# Patient Record
Sex: Female | Born: 1985 | Race: Black or African American | Hispanic: No | Marital: Married | Smoking: Never smoker
Health system: Southern US, Community
[De-identification: ages and names within clinical notes are randomized; demographics above are authoritative.]

## PROBLEM LIST (undated history)

## (undated) DIAGNOSIS — G44209 Tension-type headache, unspecified, not intractable: Secondary | ICD-10-CM

## (undated) DIAGNOSIS — D649 Anemia, unspecified: Secondary | ICD-10-CM

## (undated) DIAGNOSIS — D219 Benign neoplasm of connective and other soft tissue, unspecified: Secondary | ICD-10-CM

## (undated) DIAGNOSIS — Z803 Family history of malignant neoplasm of breast: Secondary | ICD-10-CM

## (undated) DIAGNOSIS — N920 Excessive and frequent menstruation with regular cycle: Secondary | ICD-10-CM

## (undated) DIAGNOSIS — H05119 Granuloma of unspecified orbit: Secondary | ICD-10-CM

## (undated) HISTORY — DX: Granuloma of unspecified orbit: H05.119

## (undated) HISTORY — DX: Benign neoplasm of connective and other soft tissue, unspecified: D21.9

## (undated) HISTORY — DX: Excessive and frequent menstruation with regular cycle: N92.0

## (undated) HISTORY — DX: Anemia, unspecified: D64.9

## (undated) HISTORY — DX: Family history of malignant neoplasm of breast: Z80.3

---

## 2007-11-14 DIAGNOSIS — O368390 Maternal care for abnormalities of the fetal heart rate or rhythm, unspecified trimester, not applicable or unspecified: Secondary | ICD-10-CM

## 2007-11-14 DIAGNOSIS — O4100X0 Oligohydramnios, unspecified trimester, not applicable or unspecified: Secondary | ICD-10-CM

## 2013-12-12 ENCOUNTER — Emergency Department: Payer: Self-pay | Admitting: Emergency Medicine

## 2014-01-12 ENCOUNTER — Emergency Department: Payer: Self-pay | Admitting: Emergency Medicine

## 2015-08-08 ENCOUNTER — Encounter: Payer: Self-pay | Admitting: Emergency Medicine

## 2015-08-08 ENCOUNTER — Emergency Department
Admission: EM | Admit: 2015-08-08 | Discharge: 2015-08-08 | Disposition: A | Discharge status: Home / Self Care | Payer: Self-pay | Attending: Emergency Medicine | Admitting: Emergency Medicine

## 2015-08-08 DIAGNOSIS — J301 Allergic rhinitis due to pollen: Secondary | ICD-10-CM | POA: Insufficient documentation

## 2015-08-08 DIAGNOSIS — H9201 Otalgia, right ear: Secondary | ICD-10-CM | POA: Insufficient documentation

## 2015-08-08 DIAGNOSIS — H6981 Other specified disorders of Eustachian tube, right ear: Secondary | ICD-10-CM

## 2015-08-08 DIAGNOSIS — H6991 Unspecified Eustachian tube disorder, right ear: Secondary | ICD-10-CM | POA: Insufficient documentation

## 2015-08-08 MED ORDER — CETIRIZINE HCL 10 MG PO TABS: 10.0000 mg | ORAL_TABLET | Freq: Every day | ORAL | Status: DC

## 2015-08-08 MED ORDER — FLUTICASONE PROPIONATE 50 MCG/ACT NA SUSP
1.0000 | Freq: Two times a day (BID) | NASAL | Status: DC
Start: 2015-08-08 — End: 2016-09-27

## 2015-08-08 NOTE — ED Provider Notes (Signed)
[facility] Emergency Department Provider Note  ____________________________________________  Time seen: Approximately 3:23 PM  I have reviewed the triage vital signs and the nursing notes.   HISTORY  Chief Complaint Sinus Problem    HPI Tammy Khan is a 30 y.o. female who presents to emergency department complaining of sinus polyp. Patient states that she has had congestion, sneezing, ear pressure/pain 1 week. Patient states that last night she had to prop herself up on pillows due to the amount of nasal drainage going down the back or throat. Patient denies any facial pressure, headache, visual acuity changes, difficulty breathing, sore throat, chest pain, cough. Patient does endorse nasal congestion, ear pressure, sneezing. The patient does have a history of allergic rhinitis during allergy season but has not taken any medications prior to arrival.   History reviewed. No pertinent past medical history.  There are no active problems to display for this patient.   History reviewed. No pertinent past surgical history.  Current Outpatient Rx  Name  Route  Sig  Dispense  Refill  . cetirizine (ZYRTEC) 10 MG tablet   Oral   Take 1 tablet (10 mg total) by mouth daily.   30 tablet   0   . fluticasone (FLONASE) 50 MCG/ACT nasal spray   Each Nare   Place 1 spray into both nostrils 2 (two) times daily.   16 g   0     Allergies Review of patient's allergies indicates no known allergies.  No family history on file.  Social History Social History  Substance Use Topics  . Smoking status: Never Smoker   . Smokeless tobacco: None  . Alcohol Use: No     Review of Systems  Constitutional: No fever/chills Eyes: No visual changes. No discharge ENT: Positive for nasal congestion and sneezing. Positive for right ear pain/pressure. Cardiovascular: no chest pain. Respiratory: no cough. No SOB. Gastrointestinal: No abdominal pain.  No nausea,  no vomiting.  No diarrhea.  No constipation. Musculoskeletal: Negative for musculoskeletal pain. Skin: Negative for rash, abrasions, lacerations, ecchymosis. Neurological: Negative for headaches, focal weakness or numbness. 10-point ROS otherwise negative.  ____________________________________________   PHYSICAL EXAM:  VITAL SIGNS: ED Triage Vitals  Enc Vitals Group     BP 08/08/15 1402 127/82 mmHg     Pulse Rate 08/08/15 1402 96     Resp 08/08/15 1402 20     Temp 08/08/15 1402 99.1 F (37.3 C)     Temp Source 08/08/15 1402 Oral     SpO2 08/08/15 1402 98 %     Weight 08/08/15 1402 145 lb (65.772 kg)     Height 08/08/15 1402 5\' 4"  (1.626 m)     Head Cir --      Peak Flow --      Pain Score 08/08/15 1403 6     Pain Loc --      Pain Edu? --      Excl. in [place]? --      Constitutional: Alert and oriented. Well appearing and in no acute distress. Eyes: Conjunctivae are normal. PERRL. EOMI. Head: Atraumatic. ENT:      Ears: EACs are unremarkable bilaterally. TM on right is bulging with fluid behind it. Left sided TM is unremarkable.      Nose: Moderate clear congestion/rhinnorhea. Turbinates are boggy in appearance. Patient is nontender to percussion over frontal, ethmoid, maxillary sinuses.      Mouth/Throat: Mucous membranes are moist. Oropharynx is nonerythematous and nonedematous. Neck: No stridor.  Cardiovascular: Normal rate, regular rhythm. Normal S1 and S2.  Good peripheral circulation. Respiratory: Normal respiratory effort without tachypnea or retractions. Lungs CTAB. Good air entry to the bases with no decreased or absent breath sounds. Musculoskeletal: Full range of motion to all extremities. No gross deformities appreciated. Neurologic:  Normal speech and language. No gross focal neurologic deficits are appreciated.  Skin:  Skin is warm, dry and intact. No rash noted. Psychiatric: Mood and affect are normal. Speech and behavior are normal. Patient exhibits  appropriate insight and judgement.   ____________________________________________   LABS (all labs ordered are listed, but only abnormal results are displayed)  Labs Reviewed - No data to display ____________________________________________  EKG   ____________________________________________  RADIOLOGY   No results found.  ____________________________________________    PROCEDURES  Procedure(s) performed:       Medications - No data to display   ____________________________________________   INITIAL IMPRESSION / ASSESSMENT AND PLAN / ED COURSE  Pertinent labs & imaging results that were available during my care of the patient were reviewed by me and considered in my medical decision making (see chart for details).  Patient's diagnosis is consistent with allergic rhinitis with eustachian tube dysfunction on the right side. Exam is reassuring with no signs of bacterial sinusitis. Patient will be discharged home with prescriptions for Zyrtec and Flonase. Patient is to follow up with primary care provider as needed or otherwise directed. Patient is given ED precautions to return to the ED for any worsening or new symptoms.     ____________________________________________  FINAL CLINICAL IMPRESSION(S) / ED DIAGNOSES  Final diagnoses:  Allergic rhinitis due to pollen  Eustachian tube dysfunction, right      NEW MEDICATIONS STARTED DURING THIS VISIT:  New Prescriptions   CETIRIZINE (ZYRTEC) 10 MG TABLET    Take 1 tablet (10 mg total) by mouth daily.   FLUTICASONE (FLONASE) 50 MCG/ACT NASAL SPRAY    Place 1 spray into both nostrils 2 (two) times daily.        This chart was dictated using voice recognition software/Dragon. Despite best efforts to proofread, errors can occur which can change the meaning. Any change was purely unintentional.    Darletta Moll, PA-C 08/08/15 [place], MD 08/08/15 [phone]

## 2015-08-08 NOTE — ED Notes (Signed)
Patient presents to the ED with congestion and sinus pressure x 1 week.  Patient is complaining of right ear pain.  Patient reports difficulty sleeping at night, stating that she has to prop herself up more due to her congestion.  Patient is in no obvious distress at this time.

## 2015-08-08 NOTE — Discharge Instructions (Signed)
Allergic Rhinitis Allergic rhinitis is when the mucous membranes in the nose respond to allergens. Allergens are particles in the air that cause your body to have an allergic reaction. This causes you to release allergic antibodies. Through a chain of events, these eventually cause you to release histamine into the blood stream. Although meant to protect the body, it is this release of histamine that causes your discomfort, such as frequent sneezing, congestion, and an itchy, runny nose.  CAUSES Seasonal allergic rhinitis (hay fever) is caused by pollen allergens that may come from grasses, trees, and weeds. Year-round allergic rhinitis (perennial allergic rhinitis) is caused by allergens such as house dust mites, pet dander, and mold spores. SYMPTOMS  Nasal stuffiness (congestion).  Itchy, runny nose with sneezing and tearing of the eyes. DIAGNOSIS Your health care provider can help you determine the allergen or allergens that trigger your symptoms. If you and your health care provider are unable to determine the allergen, skin or blood testing may be used. Your health care provider will diagnose your condition after taking your health history and performing a physical exam. Your health care provider may assess you for other related conditions, such as asthma, pink eye, or an ear infection. TREATMENT Allergic rhinitis does not have a cure, but it can be controlled by:  Medicines that block allergy symptoms. These may include allergy shots, nasal sprays, and oral antihistamines.  Avoiding the allergen. Hay fever may often be treated with antihistamines in pill or nasal spray forms. Antihistamines block the effects of histamine. There are over-the-counter medicines that may help with nasal congestion and swelling around the eyes. Check with your health care provider before taking or giving this medicine. If avoiding the allergen or the medicine prescribed do not work, there are many new medicines  your health care provider can prescribe. Stronger medicine may be used if initial measures are ineffective. Desensitizing injections can be used if medicine and avoidance does not work. Desensitization is when a patient is given ongoing shots until the body becomes less sensitive to the allergen. Make sure you follow up with your health care provider if problems continue. HOME CARE INSTRUCTIONS It is not possible to completely avoid allergens, but you can reduce your symptoms by taking steps to limit your exposure to them. It helps to know exactly what you are allergic to so that you can avoid your specific triggers. SEEK MEDICAL CARE IF:  You have a fever.  You develop a cough that does not stop easily (persistent).  You have shortness of breath.  You start wheezing.  Symptoms interfere with normal daily activities.   This information is not intended to replace advice given to you by your health care provider. Make sure you discuss any questions you have with your health care provider.   Document Released: 12/20/2000 Document Revised: 04/17/2014 Document Reviewed: 12/02/2012 Elsevier Interactive Patient Education 2016 [place] media is inflammation of your middle ear. This occurs when the auditory tube (eustachian tube) leading from the back of your nose (nasopharynx) to your eardrum is blocked. This blockage may result from a cold, environmental allergies, or an upper respiratory infection. Unresolved barotitis media may lead to damage or hearing loss (barotrauma), which may become permanent. HOME CARE INSTRUCTIONS   Use medicines as recommended by your health care provider. Over-the-counter medicines will help unblock the canal and can help during times of air travel.  Do not put anything into your ears to clean or unplug them. Eardrops  will not be helpful.  Do not swim, dive, or fly until your health care provider says it is all right to do so. If these  activities are necessary, chewing gum with frequent, forceful swallowing may help. It is also helpful to hold your nose and gently blow to pop your ears for equalizing pressure changes. This forces air into the eustachian tube.  Only take over-the-counter or prescription medicines for pain, discomfort, or fever as directed by your health care provider.  A decongestant may be helpful in decongesting the middle ear and make pressure equalization easier. SEEK MEDICAL CARE IF:  You experience a serious form of dizziness in which you feel as if the room is spinning and you feel nauseated (vertigo).  Your symptoms only involve one ear. SEEK IMMEDIATE MEDICAL CARE IF:   You develop a severe headache, dizziness, or severe ear pain.  You have bloody or pus-like drainage from your ears.  You develop a fever.  Your problems do not improve or become worse. MAKE SURE YOU:   Understand these instructions.  Will watch your condition.  Will get help right away if you are not doing well or get worse.   This information is not intended to replace advice given to you by your health care provider. Make sure you discuss any questions you have with your health care provider.   Document Released: 03/24/2000 Document Revised: 01/15/2013 Document Reviewed: 10/22/2012 Elsevier Interactive Patient Education [employer].

## 2015-09-07 LAB — HM PAP SMEAR: HM Pap smear: NEGATIVE

## 2015-12-04 ENCOUNTER — Encounter: Payer: Self-pay | Admitting: Emergency Medicine

## 2015-12-04 ENCOUNTER — Emergency Department
Admission: EM | Admit: 2015-12-04 | Discharge: 2015-12-04 | Disposition: A | Discharge status: Home / Self Care | Payer: Medicaid Other | Attending: Emergency Medicine | Admitting: Emergency Medicine

## 2015-12-04 DIAGNOSIS — Z79899 Other long term (current) drug therapy: Secondary | ICD-10-CM | POA: Insufficient documentation

## 2015-12-04 DIAGNOSIS — X58XXXA Exposure to other specified factors, initial encounter: Secondary | ICD-10-CM | POA: Insufficient documentation

## 2015-12-04 DIAGNOSIS — Y929 Unspecified place or not applicable: Secondary | ICD-10-CM | POA: Insufficient documentation

## 2015-12-04 DIAGNOSIS — S39012A Strain of muscle, fascia and tendon of lower back, initial encounter: Secondary | ICD-10-CM | POA: Insufficient documentation

## 2015-12-04 DIAGNOSIS — Z7951 Long term (current) use of inhaled steroids: Secondary | ICD-10-CM | POA: Insufficient documentation

## 2015-12-04 DIAGNOSIS — Y999 Unspecified external cause status: Secondary | ICD-10-CM | POA: Insufficient documentation

## 2015-12-04 DIAGNOSIS — Y939 Activity, unspecified: Secondary | ICD-10-CM | POA: Insufficient documentation

## 2015-12-04 LAB — URINALYSIS COMPLETE WITH MICROSCOPIC (ARMC ONLY)
BACTERIA UA: NONE SEEN
BILIRUBIN URINE: NEGATIVE
GLUCOSE, UA: NEGATIVE mg/dL
KETONES UR: NEGATIVE mg/dL
LEUKOCYTES UA: NEGATIVE
Nitrite: NEGATIVE
Protein, ur: NEGATIVE mg/dL
Specific Gravity, Urine: 1.027 (ref 1.005–1.030)
pH: 5 (ref 5.0–8.0)

## 2015-12-04 MED ORDER — METHOCARBAMOL 500 MG PO TABS: 500.0000 mg | ORAL_TABLET | Freq: Four times a day (QID) | ORAL | 0 refills | Status: DC

## 2015-12-04 MED ORDER — MELOXICAM 15 MG PO TABS: 15.0000 mg | ORAL_TABLET | Freq: Every day | ORAL | 0 refills | Status: DC

## 2015-12-04 NOTE — ED Triage Notes (Signed)
Low back pain today - denies injury

## 2015-12-04 NOTE — ED Provider Notes (Signed)
[facility] Emergency Department Provider Note  ____________________________________________  Time seen: Approximately 10:59 PM  I have reviewed the triage vital signs and the nursing notes.   HISTORY  Chief Complaint Back Pain    HPI Tammy Khan is a 30 y.o. female who presents emergency department complaining of lower back pain. Patient states that she has no known injury. She is concerned that she might have a "UTI." Patient denies any dominant pain, suprapubic pain, dysuria, polyuria, hematuria. No other complaints. Patient is not having medications prior to arrival. Patient reports that pain is worse on the right side than left. It is an aching sensation. Mild to moderate.   History reviewed. No pertinent past medical history.  There are no active problems to display for this patient.   History reviewed. No pertinent surgical history.  Prior to Admission medications   Medication Sig Start Date End Date Taking? Authorizing Provider  cetirizine (ZYRTEC) 10 MG tablet Take 1 tablet (10 mg total) by mouth daily. 08/08/15   Charline Bills Nicholus Chandran, PA-C  fluticasone (FLONASE) 50 MCG/ACT nasal spray Place 1 spray into both nostrils 2 (two) times daily. 08/08/15   Charline Bills Toshika Parrow, PA-C  meloxicam (MOBIC) 15 MG tablet Take 1 tablet (15 mg total) by mouth daily. 12/04/15   Charline Bills Thoma Paulsen, PA-C  methocarbamol (ROBAXIN) 500 MG tablet Take 1 tablet (500 mg total) by mouth 4 (four) times daily. 12/04/15   Charline Bills Haizel Gatchell, PA-C    Allergies Review of patient's allergies indicates no known allergies.  History reviewed. No pertinent family history.  Social History Social History  Substance Use Topics  . Smoking status: Never Smoker  . Smokeless tobacco: Never Used  . Alcohol use No     Review of Systems  Constitutional: No fever/chills Cardiovascular: no chest pain. Respiratory: no cough. No SOB. Gastrointestinal: No abdominal pain.   No nausea, no vomiting.  No diarrhea.  No constipation. Genitourinary: Negative for dysuria. Negative for polyuria. No hematuria Musculoskeletal: Positive for low back pain Skin: Negative for rash, abrasions, lacerations, ecchymosis. Neurological: Negative for headaches, focal weakness or numbness. 10-point ROS otherwise negative.  ____________________________________________   PHYSICAL EXAM:  VITAL SIGNS: ED Triage Vitals  Enc Vitals Group     BP 12/04/15 2149 133/89     Pulse Rate 12/04/15 2149 77     Resp 12/04/15 2149 16     Temp 12/04/15 2149 98 F (36.7 C)     Temp src --      SpO2 12/04/15 2149 100 %     Weight 12/04/15 2150 165 lb (74.8 kg)     Height 12/04/15 2150 5' 1.5" (1.562 m)     Head Circumference --      Peak Flow --      Pain Score 12/04/15 2150 5     Pain Loc --      Pain Edu? --      Excl. in [place]? --      Constitutional: Alert and oriented. Well appearing and in no acute distress. Eyes: Conjunctivae are normal. PERRL. EOMI. Head: Atraumatic. Neck: No stridor.    Cardiovascular: Normal rate, regular rhythm. Normal S1 and S2.  Good peripheral circulation. Respiratory: Normal respiratory effort without tachypnea or retractions. Lungs CTAB. Good air entry to the bases with no decreased or absent breath sounds. Gastrointestinal: Bowel sounds 4 quadrants. Soft and nontender to palpation. No guarding or rigidity. No palpable masses. No distention. No CVA tenderness. Musculoskeletal: Full range of motion  to all extremities. No gross deformities appreciated.No deformities to spine noted upon inspection. Full range of motion of the spine. Patient is diffusely tender to palpation on lumbar paraspinal muscle group. No palpable abnormality to the lumbar spine. No tenderness to palpation over bilateral sciatic notches. Negative straight leg raise bilaterally. Dorsalis pedis pulse intact distally bilaterally. Sensation intact and equal lower extremities. Neurologic:   Normal speech and language. No gross focal neurologic deficits are appreciated.  Skin:  Skin is warm, dry and intact. No rash noted. Psychiatric: Mood and affect are normal. Speech and behavior are normal. Patient exhibits appropriate insight and judgement.   ____________________________________________   LABS (all labs ordered are listed, but only abnormal results are displayed)  Labs Reviewed  URINALYSIS COMPLETEWITH MICROSCOPIC ([place] ONLY) - Abnormal; Notable for the following:       Result Value   Color, Urine YELLOW (*)    APPearance CLEAR (*)    Hgb urine dipstick 1+ (*)    Squamous Epithelial / LPF 6-30 (*)    All other components within normal limits   ____________________________________________  EKG   ____________________________________________  RADIOLOGY   No results found.  ____________________________________________    PROCEDURES  Procedure(s) performed:    Procedures    Medications - No data to display   ____________________________________________   INITIAL IMPRESSION / ASSESSMENT AND PLAN / ED COURSE  Pertinent labs & imaging results that were available during my care of the patient were reviewed by me and considered in my medical decision making (see chart for details).  Review of the [place] CSRS was performed in accordance of the [place] prior to dispensing any controlled drugs.  Clinical Course    Patient's diagnosis is consistent with Lumbar strain. Exam is reassuring. Urinalysis returned with no indication of UTI. Exam is consistent with lumbar strain.. Patient will be discharged home with prescriptions for anti-inflammatories and muscle relaxers. Patient is to follow up with primary care as needed or otherwise directed. Patient is given ED precautions to return to the ED for any worsening or new symptoms.     ____________________________________________  FINAL CLINICAL IMPRESSION(S) / ED DIAGNOSES  Final diagnoses:  Lumbar  strain, initial encounter      NEW MEDICATIONS STARTED DURING THIS VISIT:  New Prescriptions   MELOXICAM (MOBIC) 15 MG TABLET    Take 1 tablet (15 mg total) by mouth daily.   METHOCARBAMOL (ROBAXIN) 500 MG TABLET    Take 1 tablet (500 mg total) by mouth 4 (four) times daily.        This chart was dictated using voice recognition software/Dragon. Despite best efforts to proofread, errors can occur which can change the meaning. Any change was purely unintentional.    Darletta Moll, PA-C 12/04/15 [id number]    Eula Listen, MD 12/04/15 [id number]

## 2016-03-31 ENCOUNTER — Emergency Department
Admission: EM | Admit: 2016-03-31 | Discharge: 2016-03-31 | Disposition: A | Discharge status: Home / Self Care | Payer: Self-pay | Attending: Emergency Medicine | Admitting: Emergency Medicine

## 2016-03-31 ENCOUNTER — Emergency Department: Payer: Self-pay

## 2016-03-31 DIAGNOSIS — Z79899 Other long term (current) drug therapy: Secondary | ICD-10-CM | POA: Insufficient documentation

## 2016-03-31 DIAGNOSIS — N939 Abnormal uterine and vaginal bleeding, unspecified: Secondary | ICD-10-CM

## 2016-03-31 DIAGNOSIS — D259 Leiomyoma of uterus, unspecified: Secondary | ICD-10-CM | POA: Insufficient documentation

## 2016-03-31 LAB — URINALYSIS, ROUTINE W REFLEX MICROSCOPIC: Specific Gravity, Urine: 1.016 (ref 1.005–1.030)

## 2016-03-31 LAB — BASIC METABOLIC PANEL
ANION GAP: 6 (ref 5–15)
BUN: 6 mg/dL (ref 6–20)
CO2: 26 mmol/L (ref 22–32)
Calcium: 8.9 mg/dL (ref 8.9–10.3)
Chloride: 104 mmol/L (ref 101–111)
Creatinine, Ser: 0.52 mg/dL (ref 0.44–1.00)
Glucose, Bld: 102 mg/dL — ABNORMAL HIGH (ref 65–99)
POTASSIUM: 3.5 mmol/L (ref 3.5–5.1)
SODIUM: 136 mmol/L (ref 135–145)

## 2016-03-31 LAB — CBC
HCT: 36.9 % (ref 35.0–47.0)
Hemoglobin: 12.5 g/dL (ref 12.0–16.0)
MCH: 27.2 pg (ref 26.0–34.0)
MCHC: 33.7 g/dL (ref 32.0–36.0)
MCV: 80.5 fL (ref 80.0–100.0)
PLATELETS: 355 10*3/uL (ref 150–440)
RBC: 4.58 MIL/uL (ref 3.80–5.20)
RDW: 15.6 % — ABNORMAL HIGH (ref 11.5–14.5)
WBC: 5.5 10*3/uL (ref 3.6–11.0)

## 2016-03-31 LAB — PREGNANCY, URINE: Preg Test, Ur: NEGATIVE

## 2016-03-31 MED ORDER — TRANEXAMIC ACID 650 MG PO TABS
1300.0000 mg | ORAL_TABLET | Freq: Once | ORAL | Status: AC
  Administered 2016-03-31: 1300 mg via ORAL
  Filled 2016-03-31: qty 2

## 2016-03-31 MED ORDER — TRANEXAMIC ACID 650 MG PO TABS: 1300.0000 mg | ORAL_TABLET | Freq: Three times a day (TID) | ORAL | 0 refills | Status: DC

## 2016-03-31 NOTE — ED Notes (Addendum)
Pelvic exam done at this time with ED provider

## 2016-03-31 NOTE — ED Triage Notes (Signed)
Pt on menstrual cycle that began yesterday. Reports very heavy, changing tampons every 2 hours. Pt alert and oriented X4, active, cooperative, pt in NAD. RR even and unlabored, color WNL.  Denies pain

## 2016-03-31 NOTE — Consult Note (Signed)
Consult History and Physical   SERVICE: Gynecology   Patient Name: Tammy Khan Patient MRN:   [id number]  CC: Heavy menstrual bleeding  HPI: Tammy Khan is a 30 y.o.  G1P1 with heavy menstrual bleeding. She reports a two-year history of cyclic heavy bleeding. She is actively trying to conceive. This morning she arrived at work on her period, but with a super plus tampon she still bled on her close, and was heavy enough to prevent basement of a further tampon.  She denies STDs, history of blood clots, anemia, diabetes, HTN.  Prior history of OCP use, but she stopped 2 years ago. Prior history of Nexplanon, which was associated with heavy bleeding and caused her to remove the Nexplanon.  Family history of fibroids. Family history of diabetes, gout.  + C/S x1  On exam by the emergency room attending, significant vaginal bleeding was noted from the cervical os. She did not have cervical motion tenderness, uterine tenderness, or abdominal pain. She received a transvaginal ultrasound and Lysteda. She states that her bleeding has now improved  Review of Systems: positives in bold GEN:   fevers, chills, weight changes, appetite changes, fatigue, night sweats HEENT:  HA, vision changes, hearing loss, congestion, rhinorrhea, sinus pressure, dysphagia CV:   CP, palpitations PULM:  SOB, cough GI:  abd pain, N/V/D/C GU:  dysuria, urgency, frequency MSK:  arthralgias, myalgias, back pain, swelling SKIN:  rashes, color changes, pallor NEURO:  numbness, weakness, tingling, seizures, dizziness, tremors PSYCH:  depression, anxiety, behavioral problems, confusion  HEME/LYMPH:  easy bruising or bleeding ENDO:  heat/cold intolerance  Past Obstetrical History: OB History    No data available      Past Gynecologic History: Patient's last menstrual period was 03/30/2016 (approximate). Menstrual frequency Q 4 wks lasting 5 days requiring 12 pads/day,  With significant  overlapping pad and tampone for night time symptoms  Past Medical History: No past medical history on file.  Past Surgical History:  History reviewed. No pertinent surgical history.  Family History:  family history is not on file.  Social History:  Social History   Social History  . Marital status: Single    Spouse name: N/A  . Number of children: N/A  . Years of education: N/A   Occupational History  . Not on file.   Social History Main Topics  . Smoking status: Never Smoker  . Smokeless tobacco: Never Used  . Alcohol use No  . Drug use: Unknown  . Sexual activity: Not on file   Other Topics Concern  . Not on file   Social History Narrative  . No narrative on file    Home Medications:  Medications reconciled in EPIC  No current facility-administered medications on file prior to encounter.    Current Outpatient Prescriptions on File Prior to Encounter  Medication Sig Dispense Refill  . cetirizine (ZYRTEC) 10 MG tablet Take 1 tablet (10 mg total) by mouth daily. 30 tablet 0  . fluticasone (FLONASE) 50 MCG/ACT nasal spray Place 1 spray into both nostrils 2 (two) times daily. 16 g 0  . meloxicam (MOBIC) 15 MG tablet Take 1 tablet (15 mg total) by mouth daily. 30 tablet 0  . methocarbamol (ROBAXIN) 500 MG tablet Take 1 tablet (500 mg total) by mouth 4 (four) times daily. 16 tablet 0    Allergies:  No Known Allergies  Physical Exam:  Temp:  [98.8 F (37.1 C)] 98.8 F (37.1 C) (12/22 1245) Pulse Rate:  [88] 88 (12/22  1245) Resp:  [16] 16 (12/22 1245) BP: (134)/(84) 134/84 (12/22 1245) SpO2:  [96 %] 96 % (12/22 1245) Weight:  [165 lb (74.8 kg)] 165 lb (74.8 kg) (12/22 1242)   General Appearance:  Well developed, well nourished, no acute distress, alert and oriented x3 HEENT:  Normocephalic atraumatic, extraocular movements intact, moist mucous membranes Cardiovascular:  Normal S1/S2, regular rate and rhythm, no murmurs Pulmonary:  clear to auscultation, no  wheezes, rales or rhonchi, symmetric air entry, good air exchange Abdomen:  Bowel sounds present, soft, nontender, nondistended, no abnormal masses, no epigastric pain Extremities:  Full range of motion, no pedal edema, 2+ distal pulses, no tenderness Skin:  normal coloration and turgor, no rashes, no suspicious skin lesions noted  Neurologic:  Cranial nerves 2-12 grossly intact, normal muscle tone, strength 5/5 all four extremities Psychiatric:  Normal mood and affect, appropriate, no AH/VH Pelvic:  NEFG, no vulvar masses or lesions, normal vaginal mucosa, minimal blood from the cervical os.   Labs/Studies:   CBC and Coags:  Lab Results  Component Value Date   WBC 5.5 03/31/2016   HGB 12.5 03/31/2016   HCT 36.9 03/31/2016   MCV 80.5 03/31/2016   PLT 355 03/31/2016   CMP:  Lab Results  Component Value Date   NA 136 03/31/2016   K 3.5 03/31/2016   CL 104 03/31/2016   CO2 26 03/31/2016   BUN 6 03/31/2016   CREATININE 0.52 03/31/2016    Other Imaging: US Transvaginal Non-ob  Result Date: 03/31/2016 CLINICAL DATA:  Heavy vaginal bleeding for 24 hours beginning with normal timed period. EXAM: TRANSABDOMINAL AND TRANSVAGINAL ULTRASOUND OF PELVIS DOPPLER ULTRASOUND OF OVARIES TECHNIQUE: Both transabdominal and transvaginal ultrasound examinations of the pelvis were performed. Transabdominal technique was performed for global imaging of the pelvis including uterus, ovaries, adnexal regions, and pelvic cul-de-sac. It was necessary to proceed with endovaginal exam following the transabdominal exam to visualize the endometrium. Color and duplex Doppler ultrasound was utilized to evaluate blood flow to the ovaries. COMPARISON:  None. FINDINGS: Uterus Measurements: 9 cm in length. Intramural mass posteriorly at the fundus measuring up to 3 cm with peripheral calcification. Endometrium Thickness: 12 mm.  Mildly heterogeneous without focal mass lesion Right ovary Measurements: 29 x 28 x 16 mm.  Normal appearance/no adnexal mass. Left ovary Measurements: 26 x 23 x 33 mm. Normal appearance/no adnexal mass. Pulsed Doppler evaluation of both ovaries demonstrates normal low-resistance arterial and venous waveforms. Other findings No abnormal free fluid. IMPRESSION: 1. 3 cm intramural fibroid. 2. 12 mm endometrial stripe without visible mass. 3. Normal ovaries. Electronically Signed   By: Monte Fantasia M.D.   On: 03/31/2016 16:34   US Pelvis Complete  Result Date: 03/31/2016 CLINICAL DATA:  Heavy vaginal bleeding for 24 hours beginning with normal timed period. EXAM: TRANSABDOMINAL AND TRANSVAGINAL ULTRASOUND OF PELVIS DOPPLER ULTRASOUND OF OVARIES TECHNIQUE: Both transabdominal and transvaginal ultrasound examinations of the pelvis were performed. Transabdominal technique was performed for global imaging of the pelvis including uterus, ovaries, adnexal regions, and pelvic cul-de-sac. It was necessary to proceed with endovaginal exam following the transabdominal exam to visualize the endometrium. Color and duplex Doppler ultrasound was utilized to evaluate blood flow to the ovaries. COMPARISON:  None. FINDINGS: Uterus Measurements: 9 cm in length. Intramural mass posteriorly at the fundus measuring up to 3 cm with peripheral calcification. Endometrium Thickness: 12 mm.  Mildly heterogeneous without focal mass lesion Right ovary Measurements: 29 x 28 x 16 mm. Normal appearance/no adnexal mass. Left  ovary Measurements: 26 x 23 x 33 mm. Normal appearance/no adnexal mass. Pulsed Doppler evaluation of both ovaries demonstrates normal low-resistance arterial and venous waveforms. Other findings No abnormal free fluid. IMPRESSION: 1. 3 cm intramural fibroid. 2. 12 mm endometrial stripe without visible mass. 3. Normal ovaries. Electronically Signed   By: Monte Fantasia M.D.   On: 03/31/2016 16:34   [place] Art/ven Flow Abd Pelv Doppler  Result Date: 03/31/2016 CLINICAL DATA:  Heavy vaginal bleeding for 24 hours  beginning with normal timed period. EXAM: TRANSABDOMINAL AND TRANSVAGINAL ULTRASOUND OF PELVIS DOPPLER ULTRASOUND OF OVARIES TECHNIQUE: Both transabdominal and transvaginal ultrasound examinations of the pelvis were performed. Transabdominal technique was performed for global imaging of the pelvis including uterus, ovaries, adnexal regions, and pelvic cul-de-sac. It was necessary to proceed with endovaginal exam following the transabdominal exam to visualize the endometrium. Color and duplex Doppler ultrasound was utilized to evaluate blood flow to the ovaries. COMPARISON:  None. FINDINGS: Uterus Measurements: 9 cm in length. Intramural mass posteriorly at the fundus measuring up to 3 cm with peripheral calcification. Endometrium Thickness: 12 mm.  Mildly heterogeneous without focal mass lesion Right ovary Measurements: 29 x 28 x 16 mm. Normal appearance/no adnexal mass. Left ovary Measurements: 26 x 23 x 33 mm. Normal appearance/no adnexal mass. Pulsed Doppler evaluation of both ovaries demonstrates normal low-resistance arterial and venous waveforms. Other findings No abnormal free fluid. IMPRESSION: 1. 3 cm intramural fibroid. 2. 12 mm endometrial stripe without visible mass. 3. Normal ovaries. Electronically Signed   By: Monte Fantasia M.D.   On: 03/31/2016 16:34     Assessment / Plan:   Tammy Khan is a 30 y.o. G1P1 who presents with abnormal uterine bleeding-fibroids.   1. Menorrhagia: Because of her desire for conception, I recommend no hormonal management at this time because her appropriate response to the tranexamic acid. She was given a prescription for the tranexamic acid and will follow up with me in 1 month for review of her bleeding and her fertility issues. If she has significant bleeding, dizziness, orthostatic symptoms, or any other concerns, she is to call our office. She understands this description and instructions, and will call her office for an appointment for an annual  exam and review of the symptoms.  Thank you for the opportunity to be involved with this pt's care.

## 2016-03-31 NOTE — ED Provider Notes (Signed)
[facility] Emergency Department Provider Note  ____________________________________________   First MD Initiated Contact with Patient 03/31/16 1418     (approximate)  I have reviewed the triage vital signs and the nursing notes.   HISTORY  Chief Complaint Vaginal Bleeding   HPI Tammy Khan is a 30 y.o. female with a history of heavy periods was presented to the emergency department today after bleeding through her tampon onto her pant to work today. She only describes mild cramping to the lower abdomen. Denies a history of fibroids. Says that she has been having heavy periods ever since stopping her birth control 2 years ago. Says that today she has been using multiple tampons.   No past medical history on file.  There are no active problems to display for this patient.   History reviewed. No pertinent surgical history.  Prior to Admission medications   Medication Sig Start Date End Date Taking? Authorizing Provider  cetirizine (ZYRTEC) 10 MG tablet Take 1 tablet (10 mg total) by mouth daily. 08/08/15   Charline Bills Cuthriell, PA-C  fluticasone (FLONASE) 50 MCG/ACT nasal spray Place 1 spray into both nostrils 2 (two) times daily. 08/08/15   Charline Bills Cuthriell, PA-C  meloxicam (MOBIC) 15 MG tablet Take 1 tablet (15 mg total) by mouth daily. 12/04/15   Charline Bills Cuthriell, PA-C  methocarbamol (ROBAXIN) 500 MG tablet Take 1 tablet (500 mg total) by mouth 4 (four) times daily. 12/04/15   Charline Bills Cuthriell, PA-C    Allergies Patient has no known allergies.  No family history on file.  Social History Social History  Substance Use Topics  . Smoking status: Never Smoker  . Smokeless tobacco: Never Used  . Alcohol use No    Review of Systems Constitutional: No fever/chills Eyes: No visual changes. ENT: No sore throat. Cardiovascular: Denies chest pain. Respiratory: Denies shortness of breath. Gastrointestinal:  No nausea, no vomiting.   No diarrhea.  No constipation. Genitourinary: Negative for dysuria. Musculoskeletal: Negative for back pain. Skin: Negative for rash. Neurological: Negative for headaches, focal weakness or numbness.  10-point ROS otherwise negative.  ____________________________________________   PHYSICAL EXAM:  VITAL SIGNS: ED Triage Vitals  Enc Vitals Group     BP 03/31/16 1245 134/84     Pulse Rate 03/31/16 1245 88     Resp 03/31/16 1245 16     Temp 03/31/16 1245 98.8 F (37.1 C)     Temp Source 03/31/16 1245 Oral     SpO2 03/31/16 1245 96 %     Weight 03/31/16 1242 165 lb (74.8 kg)     Height 03/31/16 1242 5\' 4"  (1.626 m)     Head Circumference --      Peak Flow --      Pain Score 03/31/16 1409 5     Pain Loc --      Pain Edu? --      Excl. in [place]? --     Constitutional: Alert and oriented. Well appearing and in no acute distress. Eyes: Conjunctivae are normal. PERRL. EOMI. Head: Atraumatic. Nose: No congestion/rhinnorhea. Mouth/Throat: Mucous membranes are moist.   Neck: No stridor.   Cardiovascular: Normal rate, regular rhythm. Grossly normal heart sounds.  Good peripheral circulation. Respiratory: Normal respiratory effort.  No retractions. Lungs CTAB. Gastrointestinal: Soft and nontender. No distention. No CVA ttp bilaterally.   Genitourinary:  Blood to the perineum. Speculum exam with a small amount of pooling clots which were removed with Q-tip but quickly reaccumulated. Active  bleeding visualized from the cervix. Musculoskeletal: No lower extremity tenderness nor edema.  No joint effusions. Neurologic:  Normal speech and language. No gross focal neurologic deficits are appreciated. No gait instability. Skin:  Skin is warm, dry and intact. No rash noted. Psychiatric: Mood and affect are normal. Speech and behavior are normal.  ____________________________________________   LABS (all labs ordered are listed, but only abnormal results are displayed)  Labs Reviewed  CBC -  Abnormal; Notable for the following:       Result Value   RDW 15.6 (*)    All other components within normal limits  BASIC METABOLIC PANEL - Abnormal; Notable for the following:    Glucose, Bld 102 (*)    All other components within normal limits  URINALYSIS, ROUTINE W REFLEX MICROSCOPIC - Abnormal; Notable for the following:    Color, Urine RED (*)    APPearance CLOUDY (*)    Glucose, UA   (*)    Value: TEST NOT REPORTED DUE TO COLOR INTERFERENCE OF URINE PIGMENT   Hgb urine dipstick   (*)    Value: TEST NOT REPORTED DUE TO COLOR INTERFERENCE OF URINE PIGMENT   Bilirubin Urine   (*)    Value: TEST NOT REPORTED DUE TO COLOR INTERFERENCE OF URINE PIGMENT   Ketones, ur   (*)    Value: TEST NOT REPORTED DUE TO COLOR INTERFERENCE OF URINE PIGMENT   Protein, ur   (*)    Value: TEST NOT REPORTED DUE TO COLOR INTERFERENCE OF URINE PIGMENT   Nitrite   (*)    Value: TEST NOT REPORTED DUE TO COLOR INTERFERENCE OF URINE PIGMENT   Leukocytes, UA   (*)    Value: TEST NOT REPORTED DUE TO COLOR INTERFERENCE OF URINE PIGMENT   All other components within normal limits  PREGNANCY, URINE   ____________________________________________  EKG   ____________________________________________  RADIOLOGY    [place] Art/Ven Flow Abd Pelv Doppler (Final result)  Result time 03/31/16 16:34:30  Final result by Gilford Silvius, MD (03/31/16 16:34:30)           Narrative:   CLINICAL DATA: Heavy vaginal bleeding for 24 hours beginning with normal timed period.  EXAM: TRANSABDOMINAL AND TRANSVAGINAL ULTRASOUND OF PELVIS  DOPPLER ULTRASOUND OF OVARIES  TECHNIQUE: Both transabdominal and transvaginal ultrasound examinations of the pelvis were performed. Transabdominal technique was performed for global imaging of the pelvis including uterus, ovaries, adnexal regions, and pelvic cul-de-sac.  It was necessary to proceed with endovaginal exam following the transabdominal exam to visualize the  endometrium. Color and duplex Doppler ultrasound was utilized to evaluate blood flow to the ovaries.  COMPARISON: None.  FINDINGS: Uterus  Measurements: 9 cm in length. Intramural mass posteriorly at the fundus measuring up to 3 cm with peripheral calcification.  Endometrium  Thickness: 12 mm. Mildly heterogeneous without focal mass lesion  Right ovary  Measurements: 29 x 28 x 16 mm. Normal appearance/no adnexal mass.  Left ovary  Measurements: 26 x 23 x 33 mm. Normal appearance/no adnexal mass.  Pulsed Doppler evaluation of both ovaries demonstrates normal low-resistance arterial and venous waveforms.  Other findings  No abnormal free fluid.  IMPRESSION: 1. 3 cm intramural fibroid. 2. 12 mm endometrial stripe without visible mass. 3. Normal ovaries.   Electronically Signed By: Monte Fantasia M.D. On: 03/31/2016 16:34          ____________________________________________   PROCEDURES  Procedure(s) performed:   Procedures  Critical Care performed:   ____________________________________________   INITIAL  IMPRESSION / ASSESSMENT AND PLAN / ED COURSE  Pertinent labs & imaging results that were available during my care of the patient were reviewed by me and considered in my medical decision making (see chart for details).  ----------------------------------------- 2:48 PM on 03/31/2016 -----------------------------------------  Discussed case with Dr. Leafy Ro of OB/GYN. She recommends lysed either. The patient denies any history of blood clots. Denies smoking. Dr. Leafy Ro also recommends ultrasound will be down to see the patient after she is finished in the operating room.  Clinical Course    ----------------------------------------- 6:19 PM on 03/31/2016 -----------------------------------------  Patient says that her bleeding has slowed. Evaluated by Dr. Leafy Ro of OB/GYN who is recommending lysteda, 3 times a day for 5 days. The  patient will be following up in 1 month or sooner if the bleeding returns. Patient understands the plan and is willing to comply. ____________________________________________   FINAL CLINICAL IMPRESSION(S) / ED DIAGNOSES  Final diagnoses:  Vaginal bleeding  Vaginal bleeding  Uterine fibroid    NEW MEDICATIONS STARTED DURING THIS VISIT:  New Prescriptions   No medications on file     Note:  This document was prepared using Dragon voice recognition software and may include unintentional dictation errors.    Orbie Pyo, MD 03/31/16 [phone]

## 2016-03-31 NOTE — ED Notes (Addendum)
Pt states " my cycle is extra heavy" Pt denies any dizziness or weakness. Pt states her period has been ongoing xfew months. Pt states changing tampons q2hrs. Pt in NAD at this time

## 2016-06-14 NOTE — Patient Instructions (Signed)
  Your procedure is scheduled on: 06-19-16 [facility]) Report to Same Day Surgery 2nd floor medical mall [facility] Entrance-take elevator on left to 2nd floor.  Check in with surgery information desk.) To find out your arrival time please call [phone] between 1PM - 3PM on 06-16-16 (FRIDAY)  Remember: Instructions that are not followed completely may result in serious medical risk, up to and including death, or upon the discretion of your surgeon and anesthesiologist your surgery may need to be rescheduled.    _x___ 1. Do not eat food or drink liquids after midnight. No gum chewing or  hard candies.     __x__ 2. No Alcohol for 24 hours before or after surgery.   __x__3. No Smoking for 24 prior to surgery.   ____  4. Bring all medications with you on the day of surgery if instructed.    __x__ 5. Notify your doctor if there is any change in your medical condition     (cold, fever, infections).     Do not wear jewelry, make-up, hairpins, clips or nail polish.  Do not wear lotions, powders, or perfumes. You may wear deodorant.  Do not shave 48 hours prior to surgery. Men may shave face and neck.  Do not bring valuables to the hospital.    [facility] is not responsible for any belongings or valuables.               Contacts, dentures or bridgework may not be worn into surgery.  Leave your suitcase in the car. After surgery it may be brought to your room.  For patients admitted to the hospital, discharge time is determined by your  treatment team.   Patients discharged the day of surgery will not be allowed to drive home.  You will need someone to drive you home and stay with you the night of your procedure.    Please read over the following fact sheets that you were given:      ____ Take anti-hypertensive (unless it includes a diuretic), cardiac, seizure, asthma,     anti-reflux and psychiatric medicines. These include:  1. NONE  2.  3.  4.  5.  6.  ____Fleets enema or  Magnesium Citrate as directed.   ____ Use CHG Soap or sage wipes as directed on instruction sheet   ____ Use inhalers on the day of surgery and bring to hospital day of surgery  ____ Stop Metformin and Janumet 2 days prior to surgery.    ____ Take 1/2 of usual insulin dose the night before surgery and none on the morning     surgery.   ____ Follow recommendations from Cardiologist, Pulmonologist or PCP regarding stopping Aspirin, Coumadin, Pllavix ,Eliquis, Effient, or Pradaxa, and Pletal.  X____Stop Anti-inflammatories such as Advil, Aleve, Ibuprofen, Motrin, Naproxen, Naprosyn, Goodies powders or aspirin products NOW- OK to take Tylenol   ____ Stop supplements until after surgery.     ____ Bring C-Pap to the hospital.

## 2016-06-15 ENCOUNTER — Encounter
Admission: RE | Admit: 2016-06-15 | Discharge: 2016-06-15 | Disposition: A | Discharge status: Home / Self Care | Payer: BLUE CROSS/BLUE SHIELD | Source: Ambulatory Visit | Attending: Obstetrics & Gynecology | Admitting: Obstetrics & Gynecology

## 2016-06-15 DIAGNOSIS — Z01818 Encounter for other preprocedural examination: Secondary | ICD-10-CM | POA: Diagnosis not present

## 2016-06-15 DIAGNOSIS — N938 Other specified abnormal uterine and vaginal bleeding: Secondary | ICD-10-CM | POA: Diagnosis not present

## 2016-06-15 LAB — BASIC METABOLIC PANEL
ANION GAP: 4 — AB (ref 5–15)
BUN: 8 mg/dL (ref 6–20)
CHLORIDE: 106 mmol/L (ref 101–111)
CO2: 28 mmol/L (ref 22–32)
Calcium: 8.7 mg/dL — ABNORMAL LOW (ref 8.9–10.3)
Creatinine, Ser: 0.6 mg/dL (ref 0.44–1.00)
GFR calc Af Amer: >60 mL/min (ref >60)
Glucose, Bld: 91 mg/dL (ref 65–99)
POTASSIUM: 3.7 mmol/L (ref 3.5–5.1)
SODIUM: 138 mmol/L (ref 135–145)

## 2016-06-15 LAB — TYPE AND SCREEN
ABO/RH(D): O POS
ANTIBODY SCREEN: NEGATIVE

## 2016-06-15 LAB — CBC
HEMATOCRIT: 34.3 % — AB (ref 35.0–47.0)
HEMOGLOBIN: 11.2 g/dL — AB (ref 12.0–16.0)
MCH: 24.1 pg — ABNORMAL LOW (ref 26.0–34.0)
MCHC: 32.6 g/dL (ref 32.0–36.0)
MCV: 74.1 fL — AB (ref 80.0–100.0)
Platelets: 408 10*3/uL (ref 150–440)
RBC: 4.63 MIL/uL (ref 3.80–5.20)
RDW: 15 % — ABNORMAL HIGH (ref 11.5–14.5)
WBC: 6.4 10*3/uL (ref 3.6–11.0)

## 2016-06-16 MED ORDER — LIDOCAINE HCL (PF) 1 % IJ SOLN
INTRAMUSCULAR | Status: AC
  Filled 2016-06-16: qty 30

## 2016-06-19 ENCOUNTER — Ambulatory Visit
Admission: RE | Admit: 2016-06-19 | Discharge: 2016-06-19 | Disposition: A | Discharge status: Home / Self Care | Payer: BLUE CROSS/BLUE SHIELD | Source: Ambulatory Visit | Attending: Obstetrics & Gynecology | Admitting: Obstetrics & Gynecology

## 2016-06-19 ENCOUNTER — Ambulatory Visit: Payer: BLUE CROSS/BLUE SHIELD | Admitting: Anesthesiology

## 2016-06-19 ENCOUNTER — Encounter: Payer: Self-pay | Admitting: *Deleted

## 2016-06-19 ENCOUNTER — Encounter
Admission: RE | Disposition: A | Discharge status: Home / Self Care | Payer: Self-pay | Source: Ambulatory Visit | Attending: Obstetrics & Gynecology

## 2016-06-19 DIAGNOSIS — I10 Essential (primary) hypertension: Secondary | ICD-10-CM | POA: Diagnosis not present

## 2016-06-19 DIAGNOSIS — N938 Other specified abnormal uterine and vaginal bleeding: Secondary | ICD-10-CM | POA: Insufficient documentation

## 2016-06-19 DIAGNOSIS — R938 Abnormal findings on diagnostic imaging of other specified body structures: Secondary | ICD-10-CM | POA: Insufficient documentation

## 2016-06-19 DIAGNOSIS — Z79899 Other long term (current) drug therapy: Secondary | ICD-10-CM | POA: Diagnosis not present

## 2016-06-19 DIAGNOSIS — Z6830 Body mass index (BMI) 30.0-30.9, adult: Secondary | ICD-10-CM | POA: Diagnosis not present

## 2016-06-19 DIAGNOSIS — E669 Obesity, unspecified: Secondary | ICD-10-CM | POA: Diagnosis not present

## 2016-06-19 HISTORY — PX: HYSTEROSCOPY WITH D & C: SHX1775

## 2016-06-19 LAB — POCT PREGNANCY, URINE: Preg Test, Ur: NEGATIVE

## 2016-06-19 LAB — ABO/RH: ABO/RH(D): O POS

## 2016-06-19 SURGERY — DILATATION AND CURETTAGE /HYSTEROSCOPY
Anesthesia: General | Wound class: Clean Contaminated

## 2016-06-19 MED ORDER — PROPOFOL 10 MG/ML IV BOLUS
INTRAVENOUS | Status: AC
  Filled 2016-06-19: qty 20

## 2016-06-19 MED ORDER — PROMETHAZINE HCL 25 MG/ML IJ SOLN: 6.2500 mg | INTRAMUSCULAR | Status: DC | PRN

## 2016-06-19 MED ORDER — LIDOCAINE HCL (CARDIAC) 20 MG/ML IV SOLN
INTRAVENOUS | Status: DC | PRN
  Administered 2016-06-19: 80 mg via INTRAVENOUS

## 2016-06-19 MED ORDER — DEXAMETHASONE SODIUM PHOSPHATE 10 MG/ML IJ SOLN
INTRAMUSCULAR | Status: AC
  Filled 2016-06-19: qty 1

## 2016-06-19 MED ORDER — ACETAMINOPHEN 10 MG/ML IV SOLN
INTRAVENOUS | Status: AC
  Filled 2016-06-19: qty 100

## 2016-06-19 MED ORDER — LIDOCAINE HCL (PF) 1 % IJ SOLN
INTRAMUSCULAR | Status: AC
  Filled 2016-06-19: qty 30

## 2016-06-19 MED ORDER — FENTANYL CITRATE (PF) 100 MCG/2ML IJ SOLN
INTRAMUSCULAR | Status: AC
  Filled 2016-06-19: qty 2

## 2016-06-19 MED ORDER — PROPOFOL 10 MG/ML IV BOLUS
INTRAVENOUS | Status: DC | PRN
  Administered 2016-06-19: 160 mg via INTRAVENOUS
  Administered 2016-06-19: 40 mg via INTRAVENOUS

## 2016-06-19 MED ORDER — ONDANSETRON HCL 4 MG/2ML IJ SOLN
INTRAMUSCULAR | Status: DC | PRN
  Administered 2016-06-19: 4 mg via INTRAVENOUS

## 2016-06-19 MED ORDER — SEVOFLURANE IN SOLN
RESPIRATORY_TRACT | Status: AC
  Filled 2016-06-19: qty 250

## 2016-06-19 MED ORDER — FAMOTIDINE 20 MG PO TABS
ORAL_TABLET | ORAL | Status: AC
  Administered 2016-06-19: 20 mg via ORAL
  Filled 2016-06-19: qty 1

## 2016-06-19 MED ORDER — MIDAZOLAM HCL 2 MG/2ML IJ SOLN
INTRAMUSCULAR | Status: DC | PRN
  Administered 2016-06-19 (×2): 2 mg via INTRAVENOUS

## 2016-06-19 MED ORDER — LACTATED RINGERS IV SOLN
INTRAVENOUS | Status: DC | PRN
  Administered 2016-06-19: 12:00:00 via INTRAVENOUS

## 2016-06-19 MED ORDER — MIDAZOLAM HCL 2 MG/2ML IJ SOLN
INTRAMUSCULAR | Status: AC
  Filled 2016-06-19: qty 2

## 2016-06-19 MED ORDER — LACTATED RINGERS IV SOLN
INTRAVENOUS | Status: DC
  Administered 2016-06-19: 10:00:00 via INTRAVENOUS

## 2016-06-19 MED ORDER — KETOROLAC TROMETHAMINE 30 MG/ML IJ SOLN
INTRAMUSCULAR | Status: DC | PRN
  Administered 2016-06-19: 30 mg via INTRAVENOUS

## 2016-06-19 MED ORDER — MEPERIDINE HCL 50 MG/ML IJ SOLN: 6.2500 mg | INTRAMUSCULAR | Status: DC | PRN

## 2016-06-19 MED ORDER — OXYCODONE HCL 5 MG PO TABS: 5.0000 mg | ORAL_TABLET | Freq: Once | ORAL | Status: DC | PRN

## 2016-06-19 MED ORDER — FENTANYL CITRATE (PF) 100 MCG/2ML IJ SOLN
25.0000 ug | INTRAMUSCULAR | Status: DC | PRN
Start: 2016-06-19 — End: 2016-06-19
  Administered 2016-06-19: 25 ug via INTRAVENOUS

## 2016-06-19 MED ORDER — LIDOCAINE HCL (PF) 2 % IJ SOLN
INTRAMUSCULAR | Status: AC
  Filled 2016-06-19: qty 2

## 2016-06-19 MED ORDER — ONDANSETRON HCL 4 MG/2ML IJ SOLN
INTRAMUSCULAR | Status: AC
  Filled 2016-06-19: qty 2

## 2016-06-19 MED ORDER — ACETAMINOPHEN 10 MG/ML IV SOLN
INTRAVENOUS | Status: DC | PRN
  Administered 2016-06-19: 1000 mg via INTRAVENOUS

## 2016-06-19 MED ORDER — DEXAMETHASONE SODIUM PHOSPHATE 10 MG/ML IJ SOLN
INTRAMUSCULAR | Status: DC | PRN
  Administered 2016-06-19: 10 mg via INTRAVENOUS

## 2016-06-19 MED ORDER — OXYCODONE HCL 5 MG/5ML PO SOLN: 5.0000 mg | Freq: Once | ORAL | Status: DC | PRN

## 2016-06-19 MED ORDER — FAMOTIDINE 20 MG PO TABS
20.0000 mg | ORAL_TABLET | Freq: Once | ORAL | Status: AC
  Administered 2016-06-19: 20 mg via ORAL

## 2016-06-19 MED ORDER — FENTANYL CITRATE (PF) 100 MCG/2ML IJ SOLN
INTRAMUSCULAR | Status: DC | PRN
  Administered 2016-06-19 (×2): 50 ug via INTRAVENOUS

## 2016-06-19 SURGICAL SUPPLY — 17 items
BAG INFUSER PRESSURE 100CC (MISCELLANEOUS) ×3 IMPLANT
ELECT REM PT RETURN 9FT ADLT (ELECTROSURGICAL) ×3
ELECTRODE REM PT RTRN 9FT ADLT (ELECTROSURGICAL) ×1 IMPLANT
GLOVE PI ORTHOPRO 6.5 (GLOVE) ×2
GLOVE PI ORTHOPRO STRL 6.5 (GLOVE) ×1 IMPLANT
GLOVE SURG SYN 6.5 ES PF (GLOVE) ×3 IMPLANT
GOWN STRL REUS W/ TWL LRG LVL3 (GOWN DISPOSABLE) ×2 IMPLANT
GOWN STRL REUS W/TWL LRG LVL3 (GOWN DISPOSABLE) ×4
IV LACTATED RINGERS 1000ML (IV SOLUTION) ×3 IMPLANT
KIT RM TURNOVER CYSTO AR (KITS) ×3 IMPLANT
NEEDLE SPNL 22GX3.5 QUINCKE BK (NEEDLE) ×3 IMPLANT
PACK DNC HYST (MISCELLANEOUS) ×3 IMPLANT
PAD OB MATERNITY 4.3X12.25 (PERSONAL CARE ITEMS) ×3 IMPLANT
PAD PREP 24X41 OB/GYN DISP (PERSONAL CARE ITEMS) ×3 IMPLANT
SYRINGE 10CC LL (SYRINGE) ×3 IMPLANT
TUBING CONNECTING 10 (TUBING) ×2 IMPLANT
TUBING CONNECTING 10' (TUBING) ×1

## 2016-06-19 NOTE — Anesthesia Post-op Follow-up Note (Cosign Needed)
Anesthesia QCDR form completed.        

## 2016-06-19 NOTE — Anesthesia Postprocedure Evaluation (Signed)
Anesthesia Post Note  Patient: Tammy Khan  Procedure(s) Performed: Procedure(s) (LRB): DILATATION AND CURETTAGE /HYSTEROSCOPY (N/A)  Patient location during evaluation: PACU Anesthesia Type: General Level of consciousness: awake and alert and oriented Pain management: pain level controlled Vital Signs Assessment: post-procedure vital signs reviewed and stable Respiratory status: spontaneous breathing, nonlabored ventilation and respiratory function stable Cardiovascular status: blood pressure returned to baseline and stable Postop Assessment: no signs of nausea or vomiting Anesthetic complications: no     Last Vitals:  Vitals:   06/19/16 1346 06/19/16 1407  BP: 120/78   Pulse: 71 86  Resp: 16 12  Temp:      Last Pain:  Vitals:   06/19/16 1407  TempSrc:   PainSc: 2                  Clydette Privitera

## 2016-06-19 NOTE — Anesthesia Procedure Notes (Signed)
Procedure Name: LMA Insertion Date/Time: 06/19/2016 12:35 PM Performed by: Darlyne Russian Pre-anesthesia Checklist: Patient identified, Emergency Drugs available, Suction available, Patient being monitored and Timeout performed Patient Re-evaluated:Patient Re-evaluated prior to inductionOxygen Delivery Method: Circle system utilized Preoxygenation: Pre-oxygenation with 100% oxygen Intubation Type: IV induction Ventilation: Mask ventilation without difficulty LMA: LMA inserted LMA Size: 4.0 Number of attempts: 1 Placement Confirmation: positive ETCO2 and breath sounds checked- equal and bilateral Tube secured with: Tape Dental Injury: Teeth and Oropharynx as per pre-operative assessment

## 2016-06-19 NOTE — Discharge Instructions (Signed)
You should expect to have some cramping and vaginal bleeding for about a week. This should taper off and subside, much like a period. If heavy bleeding continues or gets worse, you should contact the office for an earlier appointment.  ° °Please call the office or physician on call for fever >101, severe pain, and heavy bleeding.   [phone] ° °NOTHING IN THE VAGINA FOR 2 WEEKS!! ° ° ° °

## 2016-06-19 NOTE — Op Note (Signed)
Operative Report Hysteroscopy, Dilation and Curettage 06/19/2016  Patient:  Tammy Khan  31 y.o. female Preoperative diagnosis:  DUB, thickened endometrium Postoperative diagnosis:  DUB, thickened endometrium  PROCEDURE:  Procedure(s): DILATATION AND CURETTAGE /HYSTEROSCOPY (N/A) Surgeon:  Surgeon(s) and Role:    * Surafel Hilleary Loletha Grayer Daneli Butkiewicz, MD - Primary Anesthesia:  LMA I/O: Total I/O In: -  Out: 10 [Blood:10] Specimens:  Endometrial curettings Complications: None Apparent Disposition:  VS stable to PACU  Findings: Uterus, mobile, normal size, sounding to 7 cm; normal cervix, vagina, perineum. Fluffy endometrium, no pedunculated polyp  Indication for procedure/Consents: 31 y.o.   here for scheduled surgery for the aforementioned diagnoses.  Risks of surgery were discussed with the patient including but not limited to: bleeding which may require transfusion; infection which may require antibiotics; injury to uterus or surrounding organs; intrauterine scarring which may impair future fertility; need for additional procedures including laparotomy or laparoscopy; and other postoperative/anesthesia complications. Written informed consent was obtained.    Procedure Details:   The patient was then taken to the operating room where anesthesia was administered and was found to be adequate.  After a formal and adequate timeout was performed, she was placed in the dorsal lithotomy position and examined with the above findings. She was then prepped and draped in the sterile manner.  A speculum was then placed in the patient's vagina and a single tooth tenaculum was applied to the posterior lip of the cervix.    The uterus was sounded to 7cm. Her cervix was serially dilated to accommodate the hysteroscope, with findings as above. A sharp curettage was then performed until there was a gritty texture in all four quadrants. The specimen was handed off to nursing.  The camera was reinserted and confirmed the  uterus had been evacuated. The tenaculum was removed from the cervix and the vaginal speculum was removed after noting good hemostasis. The patient tolerated the procedure well and was taken to the recovery area awake, extubated and in stable condition.  The patient will be discharged to home as per PACU criteria.  Routine postoperative instructions given. She will follow up in the clinic in two to four weeks for postoperative evaluation.  Larey Days, MD [facility] OBGYN Attending Gynecologist

## 2016-06-19 NOTE — H&P (Signed)
Preoperative History and Physical  Tammy Khan is a 31 y.o. G1P1 here for surgical management of heavy menstrual bleeding and thickened endometrium..   No significant preoperative concerns.  Proposed surgery: hysteroscopy, dilation and curettage  History reviewed. No pertinent past medical history. Past Surgical History:  Procedure Laterality Date  . CESAREAN SECTION  2009   OB History  No data available  Patient denies any other pertinent gynecologic issues.   No current facility-administered medications on file prior to encounter.    Current Outpatient Prescriptions on File Prior to Encounter  Medication Sig Dispense Refill  . cetirizine (ZYRTEC) 10 MG tablet Take 1 tablet (10 mg total) by mouth daily. (Patient not taking: Reported on 06/09/2016) 30 tablet 0  . fluticasone (FLONASE) 50 MCG/ACT nasal spray Place 1 spray into both nostrils 2 (two) times daily. (Patient not taking: Reported on 06/09/2016) 16 g 0  . meloxicam (MOBIC) 15 MG tablet Take 1 tablet (15 mg total) by mouth daily. (Patient not taking: Reported on 06/09/2016) 30 tablet 0  . methocarbamol (ROBAXIN) 500 MG tablet Take 1 tablet (500 mg total) by mouth 4 (four) times daily. (Patient not taking: Reported on 06/09/2016) 16 tablet 0  . tranexamic acid (LYSTEDA) 650 MG TABS tablet Take 2 tablets (1,300 mg total) by mouth 3 (three) times daily. (Patient not taking: Reported on 06/09/2016) 15 tablet 0   Allergies  Allergen Reactions  . Iodine Other (See Comments)    "HOTFLASHES"-PT STATES THIS HAPPENS ONLY WHEN IODINE TOUCHES HER SKIN  . Raspberry Rash    Social History:   reports that she has never smoked. She has never used smokeless tobacco. She reports that she does not drink alcohol or use drugs.  History reviewed. No pertinent family history. no gyn cancers  Review of Systems: Noncontributory  PHYSICAL EXAM: Blood pressure (!) 142/74, pulse 82, temperature 98.5 F (36.9 C), temperature source Oral, resp. rate 18,  height 5\' 4"  (1.626 m), weight 79.4 kg (175 lb), last menstrual period 05/17/2016, SpO2 100 %. General appearance - alert, well appearing, and in no distress Chest - clear to auscultation, no wheezes, rales or rhonchi, symmetric air entry Heart - normal rate and regular rhythm Abdomen - soft, nontender, nondistended, no masses or organomegaly Pelvic - examination not indicated Extremities - peripheral pulses normal, no pedal edema, no clubbing or cyanosis  Labs: Results for orders placed or performed during the hospital encounter of 06/19/16 (from the past 336 hour(s))  Pregnancy, urine POC   Collection Time: 06/19/16  9:57 AM  Result Value Ref Range   Preg Test, Ur NEGATIVE NEGATIVE  ABO/Rh   Collection Time: 06/19/16 10:19 AM  Result Value Ref Range   ABO/RH(D) O POS   Results for orders placed or performed during the hospital encounter of 06/15/16 (from the past 336 hour(s))  Basic metabolic panel   Collection Time: 06/15/16 12:26 PM  Result Value Ref Range   Sodium 138 135 - 145 mmol/L   Potassium 3.7 3.5 - 5.1 mmol/L   Chloride 106 101 - 111 mmol/L   CO2 28 22 - 32 mmol/L   Glucose, Bld 91 65 - 99 mg/dL   BUN 8 6 - 20 mg/dL   Creatinine, Ser 0.60 0.44 - 1.00 mg/dL   Calcium 8.7 (L) 8.9 - 10.3 mg/dL   GFR calc non Af Amer >60 >60 mL/min   GFR calc Af Amer >60 >60 mL/min   Anion gap 4 (L) 5 - 15  CBC  Collection Time: 06/15/16 12:26 PM  Result Value Ref Range   WBC 6.4 3.6 - 11.0 K/uL   RBC 4.63 3.80 - 5.20 MIL/uL   Hemoglobin 11.2 (L) 12.0 - 16.0 g/dL   HCT 34.3 (L) 35.0 - 47.0 %   MCV 74.1 (L) 80.0 - 100.0 fL   MCH 24.1 (L) 26.0 - 34.0 pg   MCHC 32.6 32.0 - 36.0 g/dL   RDW 15.0 (H) 11.5 - 14.5 %   Platelets 408 150 - 440 K/uL  Type and screen [place]   Collection Time: 06/15/16 12:26 PM  Result Value Ref Range   ABO/RH(D) O POS    Antibody Screen NEG    Sample Expiration 06/29/2016    Extend sample reason NO TRANSFUSIONS OR PREGNANCY  IN THE PAST 3 MONTHS      Plan: Patient will undergo surgical management with dilation and curettage.   The risks of surgery were discussed in detail with the patient including but not limited to: bleeding which may require transfusion or reoperation; infection which may require antibiotics; injury to surrounding organs which may involve bowel, bladder, ureters ; need for additional procedures including laparoscopy or laparotomy; thromboembolic phenomenon, surgical site problems and other postoperative/anesthesia complications. Likelihood of success in alleviating the patient's condition was discussed. Routine postoperative instructions will be reviewed with the patient and her family in detail after surgery.  The patient concurred with the proposed plan, giving informed written consent for the surgery.  Patient has been NPO since last night she will remain NPO for procedure.  Anesthesia and OR aware. SCDs ordered on call to the OR.  To OR when ready.  ----- Larey Days, MD Attending Obstetrician and Gynecologist [facility], Department of [facility]

## 2016-06-19 NOTE — Transfer of Care (Signed)
Immediate Anesthesia Transfer of Care Note  Patient: Tammy Khan  Procedure(s) Performed: Procedure(s): DILATATION AND CURETTAGE /HYSTEROSCOPY (N/A)  Patient Location: PACU  Anesthesia Type:General  Level of Consciousness: patient cooperative and responds to stimulation  Airway & Oxygen Therapy: Patient Spontanous Breathing and Patient connected to nasal cannula oxygen  Post-op Assessment: Report given to RN and Post -op Vital signs reviewed and stable  Post vital signs: Reviewed and stable  Last Vitals:  Vitals:   06/19/16 0955 06/19/16 1315  BP: (!) 142/74 120/85  Pulse: 82 79  Resp: 18 11  Temp: 36.9 C     Last Pain:  Vitals:   06/19/16 1315  TempSrc: Temporal         Complications: No apparent anesthesia complications

## 2016-06-19 NOTE — Anesthesia Preprocedure Evaluation (Signed)
Anesthesia Evaluation  Patient identified by MRN, date of birth, ID band Patient awake    Reviewed: Allergy & Precautions, NPO status , Patient's Chart, lab work & pertinent test results  History of Anesthesia Complications Negative for: history of anesthetic complications  Airway Mallampati: II  TM Distance: >3 FB Neck ROM: Full    Dental no notable dental hx.    Pulmonary neg pulmonary ROS, neg sleep apnea, neg COPD,    breath sounds clear to auscultation- rhonchi (-) wheezing      Cardiovascular Exercise Tolerance: Good (-) hypertension(-) CAD and (-) Past MI  Rhythm:Regular Rate:Normal - Systolic murmurs and - Diastolic murmurs    Neuro/Psych negative neurological ROS  negative psych ROS   GI/Hepatic negative GI ROS, Neg liver ROS,   Endo/Other  negative endocrine ROSneg diabetes  Renal/GU negative Renal ROS     Musculoskeletal negative musculoskeletal ROS (+)   Abdominal (+) + obese,   Peds  Hematology negative hematology ROS (+)   Anesthesia Other Findings   Reproductive/Obstetrics                             Anesthesia Physical Anesthesia Plan  ASA: II  Anesthesia Plan: General   Post-op Pain Management:    Induction: Intravenous  Airway Management Planned: LMA  Additional Equipment:   Intra-op Plan:   Post-operative Plan:   Informed Consent: I have reviewed the patients History and Physical, chart, labs and discussed the procedure including the risks, benefits and alternatives for the proposed anesthesia with the patient or authorized representative who has indicated his/her understanding and acceptance.   Dental advisory given  Plan Discussed with: CRNA and Anesthesiologist  Anesthesia Plan Comments:         Anesthesia Quick Evaluation

## 2016-06-20 LAB — SURGICAL PATHOLOGY

## 2016-09-27 ENCOUNTER — Ambulatory Visit (INDEPENDENT_AMBULATORY_CARE_PROVIDER_SITE_OTHER): Payer: BLUE CROSS/BLUE SHIELD | Admitting: Certified Nurse Midwife

## 2016-09-27 ENCOUNTER — Encounter: Payer: Self-pay | Admitting: Certified Nurse Midwife

## 2016-09-27 VITALS — BP 110/78 | HR 84 | Ht 64.0 in | Wt 176.0 lb

## 2016-09-27 DIAGNOSIS — Z01419 Encounter for gynecological examination (general) (routine) without abnormal findings: Secondary | ICD-10-CM | POA: Diagnosis not present

## 2016-09-27 DIAGNOSIS — Z124 Encounter for screening for malignant neoplasm of cervix: Secondary | ICD-10-CM | POA: Diagnosis not present

## 2016-09-27 DIAGNOSIS — N920 Excessive and frequent menstruation with regular cycle: Secondary | ICD-10-CM | POA: Insufficient documentation

## 2016-09-27 NOTE — Progress Notes (Signed)
Gynecology Annual Exam  PCP: Patient, No Pcp Per  Chief Complaint:  Chief Complaint  Patient presents with  . Gynecologic Exam    History of Present Illness:Tammy Khan presents today for her annual exam. She is a 31 year old female, G1 P1001, whose LMP was  09/05/2016 She has the following complaints: none. Her menses are monthly. They occur every 25-28 days apart, they last 5, are medium flow, and are with small clots since having a D&C in March of this year. She presented to the ER in February with heavy menses and was started on Lysteda and referred to Dr Leonides Schanz at [facility]. The pathology revealed proliferative endometrium without hyperplasia or polypoid tissue. She had an ultrasound that revealed a 3 cm intramural fibroid.  She occasionally will have dysmenorrhea that is relieved with ibuprofen or naprosyn.   The patient's past medical history is detailed in the past medical history section.  She is sexually active (infrequently). She is currently using nothing for contraception. She declines BC. She does not have concerns about sex. She does not request STD testing.  Her most recent pap smear was obtained 09/07/2015 and was NIL/ neg HRHPV. H/o abnormal pap with neg Colpo in approx 2009. Mammogram is not applicable. There is a positive history of breast cancer in her paternal aunt. Genetic testing has not been done. There is no family history of ovarian cancer. The patient does do occasional self breast exams.  The patient does not smoke.  The patient does not drink alcohol.  The patient does not use illegal drugs.  The patient does not exercise. She does a lot of walking at work. 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 The patient does take a multivitamin occasionally. She had a recent cholesterol screen in 2015 that was normal.    The patient denies current symptoms of depression.    Review of Systems:  Review of Systems  Constitutional: Negative for chills, fever and weight loss.  HENT: Negative for congestion, sinus pain and sore throat.   Eyes: Negative for blurred vision and pain.  Respiratory: Negative for hemoptysis, shortness of breath and wheezing.   Cardiovascular: Negative for chest pain, palpitations and leg swelling.  Gastrointestinal: Negative for abdominal pain, blood in stool, diarrhea, heartburn, nausea and vomiting.  Genitourinary: Negative for dysuria, frequency, hematuria and urgency.  Musculoskeletal: Negative for back pain, joint pain and myalgias.  Skin: Negative for itching and rash.  Neurological: Negative for dizziness, tingling and headaches.  Endo/Heme/Allergies: Negative for environmental allergies and polydipsia. Does not bruise/bleed easily.       Negative for hirsutism   Psychiatric/Behavioral: Negative for depression. The patient is not nervous/anxious and does not have insomnia.     Past Medical History:  Past Medical History:  Diagnosis Date  . Menorrhagia     Past Surgical History:  Past Surgical History:  Procedure Laterality Date  . CESAREAN SECTION  2009  . HYSTEROSCOPY W/D&C N/A 06/19/2016   Procedure: DILATATION AND CURETTAGE /HYSTEROSCOPY;  Surgeon: Honor Loh Ward, MD;  Location: [facility] ORS;  Service: Gynecology;  Laterality: N/A;    Family History:  Family History  Problem Relation Age of Onset  . Breast cancer Paternal Aunt 35  . Hypertension Father   . Hypertension Maternal Aunt   . Diabetes Maternal Aunt   . Diabetes Maternal Uncle   . Hypertension Maternal Uncle     Social  History:  Social History   Social History  . Marital status: Single    Spouse name: N/A  . Number of children: 1  . Years of education: N/A   Occupational History  . Not on file.   Social History Main Topics  . Smoking status: Never Smoker  . Smokeless tobacco: Never Used  . Alcohol use No  . Drug use: No  . Sexual activity: Yes    Partners:  Male    Birth control/ protection: None   Other Topics Concern  . Not on file   Social History Narrative  . No narrative on file    Allergies:  Allergies  Allergen Reactions  . Iodine Other (See Comments)    "HOTFLASHES"-PT STATES THIS HAPPENS ONLY WHEN IODINE TOUCHES HER SKIN  . Raspberry Rash    Medications: Prior to Admission medications   Medication Sig Start Date End Date Taking? Authorizing Provider  acetaminophen (TYLENOL) 325 MG tablet Take 650 mg by mouth every 6 (six) hours as needed.    [provider]  cetirizine (ZYRTEC) 10 MG tablet Take 1 tablet (10 mg total) by mouth daily. Patient not taking: Reported on 06/09/2016 08/08/15   Cuthriell, Charline Bills, PA-C  fluticasone (FLONASE) 50 MCG/ACT nasal spray Place 1 spray into both nostrils 2 (two) times daily. Patient not taking: Reported on 06/09/2016 08/08/15   Cuthriell, Charline Bills, PA-C  ibuprofen (ADVIL,MOTRIN) 200 MG tablet Take 400 mg by mouth every 6 (six) hours as needed for mild pain or moderate pain.    [provider]  Multivitamin occasionally Physical Exam Vitals: BP 110/78   Pulse 84   Ht 5\' 4"  (1.626 m)   Wt 176 lb (79.8 kg)   LMP 09/05/2016 (Exact Date)   BMI 30.21 kg/m   General: NAD HEENT: normocephalic, anicteric Neck: no thyroid enlargement, no palpable nodules, no cervical lymphadenopathy  Pulmonary: No increased work of breathing, CTAB Cardiovascular: RRR, without murmur  Breast: Breast symmetrical, no tenderness, no palpable nodules or masses, no skin or nipple retraction present, no nipple discharge.  No axillary, infraclavicular or supraclavicular lymphadenopathy. Abdomen: Soft, non-tender, non-distended.  Umbilicus without lesions.  No hepatomegaly or masses palpable. No evidence of hernia. Genitourinary:  External: Normal external female genitalia.  Normal urethral meatus, normal Bartholin's and Skene's glands.    Vagina: Normal vaginal mucosa, no evidence of prolapse.     Cervix: Grossly normal in appearance, anterior, friable with Pap, non-tender  Uterus: Retroflexed, globular, immobile, and non-tender  Adnexa: No adnexal masses, non-tender  Rectal: deferred  Lymphatic: no evidence of inguinal lymphadenopathy Extremities: no edema, erythema, or tenderness Neurologic: Grossly intact Psychiatric: mood appropriate, affect full     Assessment: 31 y.o. well woman exam  Plan  1) Breast cancer screening - recommend monthly self breast exam. Offered MYRISk testing due to family history of breast cancer: declined  2) Cervical cancer screening - Pap was done.   3) Contraception - OK if she conceives  4) Routine healthcare maintenance including cholesterol and diabetes screening declined   5) Encouraged multivitamin or folic acid supplements daily in case of pregnancy.  6) RTO 1 year for annual and prn   Dalia Heading, CNM

## 2016-09-28 LAB — IGP,RFX APTIMA HPV ALL PTH: PAP SMEAR COMMENT: 0

## 2016-10-10 ENCOUNTER — Encounter: Payer: Self-pay | Admitting: Certified Nurse Midwife

## 2016-10-17 ENCOUNTER — Encounter: Payer: Self-pay | Admitting: Advanced Practice Midwife

## 2016-10-17 ENCOUNTER — Ambulatory Visit (INDEPENDENT_AMBULATORY_CARE_PROVIDER_SITE_OTHER): Payer: BLUE CROSS/BLUE SHIELD | Admitting: Advanced Practice Midwife

## 2016-10-17 VITALS — BP 112/74 | HR 91 | Ht 61.5 in | Wt 177.0 lb

## 2016-10-17 DIAGNOSIS — N926 Irregular menstruation, unspecified: Secondary | ICD-10-CM | POA: Diagnosis not present

## 2016-10-17 NOTE — Progress Notes (Signed)
S: The patient is here today for questions concerning her cycle. Over the past year they have been beginning 2 days early. She has cycles ranging from 21 to 28 days that last for 7 days and are variable in quality. She has not taken hormones in approximately 3 years. She had a fibroid removed in March of this year. She declines hormonal treatment and is mainly wondering if more frequent periods will be a future problem for fertility. Reassurance given.   O: Vital Signs: BP 112/74 (BP Location: Left Arm, Patient Position: Sitting, Cuff Size: Normal)   Pulse 91   Ht 5' 1.5" (1.562 m)   Wt 177 lb (80.3 kg)   LMP 09/30/2016   BMI 32.90 kg/m  Constitutional: Well nourished, well developed female in no acute distress.  HEENT: normal Skin: Warm and dry.  Cardiovascular: Regular rate and rhythm.   Respiratory: Clear to auscultation bilateral. Normal respiratory effort Psych: Alert and Oriented x3. No memory deficits. Normal mood and affect.  MS: normal gait, normal bilateral lower extremity ROM/strength/stability.  Pelvic exam: deferred for primarily a consult visit  A: 31 y.o. Female with irregular menses  P: Follow up as needed for hormonal therapy   Rod Can, CNM

## 2017-11-03 ENCOUNTER — Other Ambulatory Visit: Payer: Self-pay

## 2017-11-03 ENCOUNTER — Emergency Department
Admission: EM | Admit: 2017-11-03 | Discharge: 2017-11-04 | Disposition: A | Discharge status: Home / Self Care | Payer: BLUE CROSS/BLUE SHIELD | Attending: Emergency Medicine | Admitting: Emergency Medicine

## 2017-11-03 DIAGNOSIS — R51 Headache: Secondary | ICD-10-CM | POA: Insufficient documentation

## 2017-11-03 DIAGNOSIS — R519 Headache, unspecified: Secondary | ICD-10-CM

## 2017-11-03 LAB — POCT PREGNANCY, URINE: PREG TEST UR: NEGATIVE

## 2017-11-03 LAB — URINALYSIS, COMPLETE (UACMP) WITH MICROSCOPIC
Bacteria, UA: NONE SEEN
Bilirubin Urine: NEGATIVE
GLUCOSE, UA: NEGATIVE mg/dL
HGB URINE DIPSTICK: NEGATIVE
Ketones, ur: NEGATIVE mg/dL
Leukocytes, UA: NEGATIVE
NITRITE: NEGATIVE
PH: 6 (ref 5.0–8.0)
Protein, ur: NEGATIVE mg/dL
SPECIFIC GRAVITY, URINE: 1.001 — AB (ref 1.005–1.030)
Squamous Epithelial / LPF: NONE SEEN (ref 0–5)
WBC, UA: NONE SEEN WBC/hpf (ref 0–5)

## 2017-11-03 MED ORDER — BUTALBITAL-APAP-CAFFEINE 50-325-40 MG PO TABS: 1.0000 | ORAL_TABLET | Freq: Four times a day (QID) | ORAL | 0 refills | Status: DC | PRN

## 2017-11-03 MED ORDER — KETOROLAC TROMETHAMINE 60 MG/2ML IM SOLN
60.0000 mg | Freq: Once | INTRAMUSCULAR | Status: AC
  Administered 2017-11-03: 60 mg via INTRAMUSCULAR
  Filled 2017-11-03: qty 2

## 2017-11-03 MED ORDER — BUTALBITAL-APAP-CAFFEINE 50-325-40 MG PO TABS
1.0000 | ORAL_TABLET | Freq: Once | ORAL | Status: AC
  Administered 2017-11-03: 1 via ORAL

## 2017-11-03 MED ORDER — BUTALBITAL-APAP-CAFFEINE 50-325-40 MG PO TABS
ORAL_TABLET | ORAL | Status: AC
  Filled 2017-11-03: qty 1

## 2017-11-03 NOTE — Discharge Instructions (Addendum)
Please seek medical attention for any high fevers, chest pain, shortness of breath, change in behavior, persistent vomiting, bloody stool or any other new or concerning symptoms.  

## 2017-11-03 NOTE — ED Notes (Signed)
Pt reports that she gets frequent sinus headaches and was told by her eye doctor that she had swelling on her optic nerve.  Pt reports that fioricet given in triage is just starting to kick in.  Pt is A&Ox4, ambulatory from triage and in NAD.

## 2017-11-03 NOTE — ED Triage Notes (Signed)
Pt arrives to ED c/o of anterior HA that has been intermittent. Alert, oriented, ambulatory. No distress noted. Denies vision changes, denies dizziness. Denies N&V. Has not seen neurologist about HA.

## 2017-11-03 NOTE — ED Provider Notes (Signed)
[facility] Emergency Department Provider Note   ____________________________________________   I have reviewed the triage vital signs and the nursing notes.   HISTORY  Chief Complaint Headache   History limited by: Not Limited   HPI Tammy Khan is a 32 y.o. female who presents to the emergency department today from a complaint of headache.  Is located in the front part of her head.  She states that it is been present for roughly 1 month.  It does come and go.  She has taken over-the-counter pain medication which will temporarily help with the pain.  The patient states that she went to her ophthalmologist she noticed that her optic nerve seemed a little swollen on the left side.  She is not sure if this is a chronic issue for her.  She also thinks that it might be related to her allergies because bad smells will make the pain worse.  She denies any recent trauma.   Per medical record review patient has a history of menorrhagia.  Past Medical History:  Diagnosis Date  . Menorrhagia     Patient Active Problem List   Diagnosis Date Noted  . Menorrhagia 09/27/2016    Past Surgical History:  Procedure Laterality Date  . CESAREAN SECTION  2009  . HYSTEROSCOPY W/D&C N/A 06/19/2016   Procedure: DILATATION AND CURETTAGE /HYSTEROSCOPY;  Surgeon: Honor Loh Ward, MD;  Location: [facility] ORS;  Service: Gynecology;  Laterality: N/A;    Prior to Admission medications   Not on File    Allergies Iodine and Raspberry  Family History  Problem Relation Age of Onset  . Breast cancer Paternal Aunt 48  . Hypertension Father   . Hypertension Maternal Aunt   . Diabetes Maternal Aunt   . Diabetes Maternal Uncle   . Hypertension Maternal Uncle     Social History Social History   Tobacco Use  . Smoking status: Never Smoker  . Smokeless tobacco: Never Used  Substance Use Topics  . Alcohol use: No  . Drug use: No    Review of Systems Constitutional: No  fever/chills Eyes: No visual changes. ENT: No sore throat. Cardiovascular: Denies chest pain. Respiratory: Denies shortness of breath. Gastrointestinal: No abdominal pain.  No nausea, no vomiting.  No diarrhea.   Genitourinary: Negative for dysuria. Musculoskeletal: Negative for back pain. Skin: Negative for rash. Neurological: Positive for headache.  ____________________________________________   PHYSICAL EXAM:  VITAL SIGNS: ED Triage Vitals  Enc Vitals Group     BP 11/03/17 1641 138/86     Pulse Rate 11/03/17 1641 94     Resp 11/03/17 1641 16     Temp 11/03/17 1641 98.9 F (37.2 C)     Temp Source 11/03/17 1641 Oral     SpO2 11/03/17 1641 100 %     Weight 11/03/17 1642 159 lb (72.1 kg)     Height 11/03/17 1642 5\' 2"  (1.575 m)     Head Circumference --      Peak Flow --      Pain Score 11/03/17 1642 5   Constitutional: Alert and oriented.  Eyes: Conjunctivae are normal.  ENT      Head: Normocephalic and atraumatic.      Nose: No congestion/rhinnorhea.      Mouth/Throat: Mucous membranes are moist.      Neck: No stridor. Hematological/Lymphatic/Immunilogical: No cervical lymphadenopathy. Cardiovascular: Normal rate, regular rhythm.  No murmurs, rubs, or gallops.  Respiratory: Normal respiratory effort without tachypnea nor retractions. Breath sounds  are clear and equal bilaterally. No wheezes/rales/rhonchi. Gastrointestinal: Soft and non tender. No rebound. No guarding.  Genitourinary: Deferred Musculoskeletal: Normal range of motion in all extremities. No lower extremity edema. Neurologic:  Normal speech and language. No gross focal neurologic deficits are appreciated.  Skin:  Skin is warm, dry and intact. No rash noted. Psychiatric: Mood and affect are normal. Speech and behavior are normal. Patient exhibits appropriate insight and judgment.  ____________________________________________    LABS (pertinent positives/negatives)  Upreg  negative  ____________________________________________   EKG  None  ____________________________________________    RADIOLOGY  None  ____________________________________________   PROCEDURES  Procedures  ____________________________________________   INITIAL IMPRESSION / ASSESSMENT AND PLAN / ED COURSE  Pertinent labs & imaging results that were available during my care of the patient were reviewed by me and considered in my medical decision making (see chart for details).   Patient presented to the emergency department today because of concerns for headache.  No neuro deficits.  Patient did feel better after medication.  She did states she was told by an ophthalmologist that she had a slightly swollen optic nerve.  At this point I doubt pseudotumor or optic neuritis however will give patient neurology follow-up.   ____________________________________________   FINAL CLINICAL IMPRESSION(S) / ED DIAGNOSES  Final diagnoses:  Bad headache     Note: This dictation was prepared with Dragon dictation. Any transcriptional errors that result from this process are unintentional     Nance Pear, MD 11/04/17 0004

## 2017-11-03 NOTE — ED Notes (Signed)
Per Dr. Archie Balboa no blood or scans at this time.

## 2017-11-04 NOTE — ED Notes (Signed)
Pt discharged to home.  Family member driving.  Discharge instructions reviewed.  Verbalized understanding.  No questions or concerns at this time.  Teach back verified.  Pt in NAD.  No items left in ED.   

## 2018-01-24 ENCOUNTER — Other Ambulatory Visit: Payer: Self-pay | Admitting: Neurology

## 2018-01-24 DIAGNOSIS — H4710 Unspecified papilledema: Secondary | ICD-10-CM

## 2018-01-31 ENCOUNTER — Other Ambulatory Visit: Payer: Self-pay

## 2018-01-31 ENCOUNTER — Emergency Department
Admission: EM | Admit: 2018-01-31 | Discharge: 2018-01-31 | Disposition: A | Discharge status: Home / Self Care | Payer: BLUE CROSS/BLUE SHIELD | Attending: Student in an Organized Health Care Education/Training Program | Admitting: Student in an Organized Health Care Education/Training Program

## 2018-01-31 ENCOUNTER — Emergency Department: Payer: BLUE CROSS/BLUE SHIELD

## 2018-01-31 ENCOUNTER — Encounter: Payer: Self-pay | Admitting: *Deleted

## 2018-01-31 DIAGNOSIS — N858 Other specified noninflammatory disorders of uterus: Secondary | ICD-10-CM

## 2018-01-31 DIAGNOSIS — D259 Leiomyoma of uterus, unspecified: Secondary | ICD-10-CM | POA: Insufficient documentation

## 2018-01-31 DIAGNOSIS — Z79899 Other long term (current) drug therapy: Secondary | ICD-10-CM | POA: Insufficient documentation

## 2018-01-31 DIAGNOSIS — M545 Low back pain: Secondary | ICD-10-CM | POA: Diagnosis present

## 2018-01-31 LAB — URINALYSIS, COMPLETE (UACMP) WITH MICROSCOPIC
BACTERIA UA: NONE SEEN
BILIRUBIN URINE: NEGATIVE
Glucose, UA: NEGATIVE mg/dL
Ketones, ur: NEGATIVE mg/dL
Leukocytes, UA: NEGATIVE
Nitrite: NEGATIVE
Protein, ur: NEGATIVE mg/dL
Specific Gravity, Urine: 1.013 (ref 1.005–1.030)
pH: 5 (ref 5.0–8.0)

## 2018-01-31 LAB — PREGNANCY, URINE: PREG TEST UR: NEGATIVE

## 2018-01-31 MED ORDER — NAPROXEN 500 MG PO TABS: 500.0000 mg | ORAL_TABLET | Freq: Two times a day (BID) | ORAL | 0 refills | Status: DC

## 2018-01-31 NOTE — ED Notes (Signed)
See triage note  Presents with urinary freq and dysuria   States sx's started about 3 days ago  Denies any n/v/ or fever

## 2018-01-31 NOTE — ED Triage Notes (Signed)
Pt to ED reporting a hx of UTIs and reports for the past two days her "kidneys" have been hurting with dysuria. No discoloration in urine and no NVD or fevers at home.

## 2018-01-31 NOTE — ED Provider Notes (Signed)
[facility] Emergency Department Provider Note  ____________________________________________  Time seen: Approximately 4:01 PM  I have reviewed the triage vital signs and the nursing notes.   HISTORY  Chief Complaint Dysuria    HPI Tammy Khan is a 32 y.o. female that presents emergency department for evaluation of left sided low back pain for 3 days.  Patient states that she has also been urinating more frequently than usual. She feels the low back pain primarily when she urinates.     Pain does not radiate.  She states that she has had a UTI before and feels like this may be the same.  She has been drinking cranberry juice.  She has never had a kidney stone. She is currently on her menstrual cycle.  No fever, chills, nausea, vomiting, abdominal pain, vaginal discharge, dysuria, hematuria, vaginal discharge.   Past Medical History:  Diagnosis Date  . Menorrhagia     Patient Active Problem List   Diagnosis Date Noted  . Menorrhagia 09/27/2016    Past Surgical History:  Procedure Laterality Date  . CESAREAN SECTION  2009  . HYSTEROSCOPY W/D&C N/A 06/19/2016   Procedure: DILATATION AND CURETTAGE /HYSTEROSCOPY;  Surgeon: Honor Loh Ward, MD;  Location: [facility] ORS;  Service: Gynecology;  Laterality: N/A;    Prior to Admission medications   Medication Sig Start Date End Date Taking? Authorizing Provider  butalbital-acetaminophen-caffeine (FIORICET, ESGIC) [phone] MG tablet Take 1 tablet by mouth every 6 (six) hours as needed for headache. 11/03/17 11/03/18  Nance Pear, MD  naproxen (NAPROSYN) 500 MG tablet Take 1 tablet (500 mg total) by mouth 2 (two) times daily with a meal. 01/31/18 01/31/19  Laban Emperor, PA-C    Allergies Iodine and Raspberry  Family History  Problem Relation Age of Onset  . Breast cancer Paternal Aunt 1  . Hypertension Father   . Hypertension Maternal Aunt   . Diabetes Maternal Aunt   . Diabetes Maternal Uncle   .  Hypertension Maternal Uncle     Social History Social History   Tobacco Use  . Smoking status: Never Smoker  . Smokeless tobacco: Never Used  Substance Use Topics  . Alcohol use: No  . Drug use: No     Review of Systems  Constitutional: No fever/chills Cardiovascular: No chest pain. Respiratory:  No SOB. Gastrointestinal: No abdominal pain.  No nausea, no vomiting.  Genitourinary: Negative for dysuria. Musculoskeletal: Positive for back pain. Skin: Negative for rash, abrasions, lacerations, ecchymosis.   ____________________________________________   PHYSICAL EXAM:  VITAL SIGNS: ED Triage Vitals  Enc Vitals Group     BP 01/31/18 1525 138/76     Pulse Rate 01/31/18 1525 85     Resp 01/31/18 1525 16     Temp 01/31/18 1525 98.9 F (37.2 C)     Temp Source 01/31/18 1525 Oral     SpO2 01/31/18 1525 100 %     Weight 01/31/18 1526 154 lb (69.9 kg)     Height 01/31/18 1526 5\' 2"  (1.575 m)     Head Circumference --      Peak Flow --      Pain Score 01/31/18 1526 5     Pain Loc --      Pain Edu? --      Excl. in [place]? --      Constitutional: Alert and oriented. Well appearing and in no acute distress. Eyes: Conjunctivae are normal. PERRL. EOMI. Head: Atraumatic. ENT:      Ears:  Nose: No congestion/rhinnorhea.      Mouth/Throat: Mucous membranes are moist.  Neck: No stridor.   Cardiovascular: Normal rate, regular rhythm.  Good peripheral circulation. Respiratory: Normal respiratory effort without tachypnea or retractions. Lungs CTAB. Good air entry to the bases with no decreased or absent breath sounds. Gastrointestinal: Bowel sounds 4 quadrants. Soft and nontender to palpation. No guarding or rigidity. No palpable masses. No distention. No CVA tenderness.  Musculoskeletal: Full range of motion to all extremities. No gross deformities appreciated.  Patient points to area of pain over left lumbar paraspinal muscles.  Normal gait. Neurologic:  Normal speech and  language. No gross focal neurologic deficits are appreciated.  Skin:  Skin is warm, dry and intact. No rash noted. Psychiatric: Mood and affect are normal. Speech and behavior are normal. Patient exhibits appropriate insight and judgement.   ____________________________________________   LABS (all labs ordered are listed, but only abnormal results are displayed)  Labs Reviewed  URINALYSIS, COMPLETE (UACMP) WITH MICROSCOPIC - Abnormal; Notable for the following components:      Result Value   Color, Urine STRAW (*)    APPearance CLEAR (*)    Hgb urine dipstick SMALL (*)    All other components within normal limits  PREGNANCY, URINE   ____________________________________________  EKG   ____________________________________________  RADIOLOGY Robinette Haines, personally viewed and evaluated these images (plain radiographs) as part of my medical decision making, as well as reviewing the written report by the radiologist.  Ct Renal Stone Study  Result Date: 01/31/2018 CLINICAL DATA:  Left-sided flank pain with dysuria EXAM: CT ABDOMEN AND PELVIS WITHOUT CONTRAST TECHNIQUE: Multidetector CT imaging of the abdomen and pelvis was performed following the standard protocol without IV contrast. COMPARISON:  None. FINDINGS: Lower chest: Lung bases demonstrate no acute consolidation or effusion. Heart size within normal limits. Hepatobiliary: No focal liver abnormality is seen. No gallstones, gallbladder wall thickening, or biliary dilatation. Pancreas: Unremarkable. No pancreatic ductal dilatation or surrounding inflammatory changes. Spleen: Normal in size without focal abnormality. Adrenals/Urinary Tract: Adrenal glands are unremarkable. Kidneys are normal, without renal calculi, focal lesion, or hydronephrosis. Bladder is unremarkable. Stomach/Bowel: Stomach is within normal limits. Appendix appears normal. No evidence of bowel wall thickening, distention, or inflammatory changes.  Vascular/Lymphatic: No significant vascular findings are present. No enlarged abdominal or pelvic lymph nodes. Reproductive: 2.8 cm calcified posterior uterine mass. Uterus appears enlarged. Suspected large myometrial mass on the left. No adnexal mass. Tampon in the vagina Other: Negative for free air or free fluid. Musculoskeletal: No acute or significant osseous findings. IMPRESSION: 1. Negative for hydronephrosis or ureteral stone 2. Uterus is somewhat enlarged in size with suspected fibroids. Electronically Signed   By: Donavan Foil M.D.   On: 01/31/2018 16:47    ____________________________________________    PROCEDURES  Procedure(s) performed:    Procedures    Medications - No data to display   ____________________________________________   INITIAL IMPRESSION / ASSESSMENT AND PLAN / ED COURSE  Pertinent labs & imaging results that were available during my care of the patient were reviewed by me and considered in my medical decision making (see chart for details).  Review of the [place] CSRS was performed in accordance of the [place] prior to dispensing any controlled drugs.     Patient's diagnosis is consistent with fibroids.  No indication of infection on urinalysis.  CT renal stone consistent with left uterine fibroid.  Patient has had these in the past and has had them surgically removed.  No  nausea, vomiting, abdominal pain.  Patient will be discharged home with prescriptions for naproxyn. Patient is to follow up with gynecology as directed. Patient is given ED precautions to return to the ED for any worsening or new symptoms.     ____________________________________________  FINAL CLINICAL IMPRESSION(S) / ED DIAGNOSES  Final diagnoses:  Uterine mass      NEW MEDICATIONS STARTED DURING THIS VISIT:  ED Discharge Orders         Ordered    naproxen (NAPROSYN) 500 MG tablet  2 times daily with meals     01/31/18 1711              This chart was dictated  using voice recognition software/Dragon. Despite best efforts to proofread, errors can occur which can change the meaning. Any change was purely unintentional.    Laban Emperor, PA-C 01/31/18 2304    Merlyn Lot, MD 02/01/18 [id number]

## 2018-02-13 ENCOUNTER — Ambulatory Visit
Admission: RE | Admit: 2018-02-13 | Discharge: 2018-02-13 | Disposition: A | Discharge status: Home / Self Care | Payer: BLUE CROSS/BLUE SHIELD | Source: Ambulatory Visit | Attending: Neurology | Admitting: Neurology

## 2018-02-13 DIAGNOSIS — R9089 Other abnormal findings on diagnostic imaging of central nervous system: Secondary | ICD-10-CM | POA: Diagnosis not present

## 2018-02-13 DIAGNOSIS — R51 Headache: Secondary | ICD-10-CM | POA: Insufficient documentation

## 2018-02-13 DIAGNOSIS — H4710 Unspecified papilledema: Secondary | ICD-10-CM | POA: Insufficient documentation

## 2018-02-22 ENCOUNTER — Other Ambulatory Visit: Payer: Self-pay | Admitting: Neurology

## 2018-02-22 DIAGNOSIS — G932 Benign intracranial hypertension: Secondary | ICD-10-CM

## 2018-03-01 ENCOUNTER — Ambulatory Visit
Admission: RE | Admit: 2018-03-01 | Discharge: 2018-03-01 | Disposition: A | Discharge status: Home / Self Care | Payer: BLUE CROSS/BLUE SHIELD | Source: Ambulatory Visit | Attending: Neurology | Admitting: Neurology

## 2018-03-01 DIAGNOSIS — G932 Benign intracranial hypertension: Secondary | ICD-10-CM | POA: Diagnosis present

## 2018-03-01 HISTORY — DX: Tension-type headache, unspecified, not intractable: G44.209

## 2018-03-01 LAB — CBC
HEMATOCRIT: 27.8 % — AB (ref 36.0–46.0)
HEMOGLOBIN: 7.6 g/dL — AB (ref 12.0–15.0)
MCH: 17.2 pg — AB (ref 26.0–34.0)
MCHC: 27.3 g/dL — ABNORMAL LOW (ref 30.0–36.0)
MCV: 63 fL — ABNORMAL LOW (ref 80.0–100.0)
PLATELETS: 458 10*3/uL — AB (ref 150–400)
RBC: 4.41 MIL/uL (ref 3.87–5.11)
RDW: 20.4 % — AB (ref 11.5–15.5)
WBC: 6.1 10*3/uL (ref 4.0–10.5)
nRBC: 0 % (ref 0.0–0.2)

## 2018-03-01 LAB — PROTIME-INR
INR: 0.95
Prothrombin Time: 12.6 seconds (ref 11.4–15.2)

## 2018-03-01 LAB — HCG, QUANTITATIVE, PREGNANCY: HCG, BETA CHAIN, QUANT, S: 1 m[IU]/mL (ref <5)

## 2018-03-01 LAB — APTT: aPTT: 26 seconds (ref 24–36)

## 2018-03-01 MED ORDER — ACETAMINOPHEN 500 MG PO TABS
1000.0000 mg | ORAL_TABLET | Freq: Four times a day (QID) | ORAL | Status: DC | PRN
  Administered 2018-03-01: 1000 mg via ORAL
  Filled 2018-03-01 (×2): qty 2

## 2018-03-01 NOTE — OR Nursing (Signed)
VSS. Patient denies headache. Post procedure lumbar puncture instructions reviewed with patient and she reports good understanding. Lower back bandaid remains dry and intact without swelling at the site noted.

## 2018-03-01 NOTE — OR Nursing (Signed)
Patient returned from radiology awake and alert with HOB flat.

## 2018-03-01 NOTE — OR Nursing (Signed)
Lower back bandaid dry and intact. No swelling at the site noted.

## 2018-03-01 NOTE — OR Nursing (Signed)
  Dr. Posey Pronto has cleared patient for discharge to home.

## 2018-03-01 NOTE — OR Nursing (Signed)
Dr. Kathreen Devoid notified of [place]. And Hgb results today and results of platelets. Hct. 27.8 and hgb 7.6.

## 2018-03-01 NOTE — OR Nursing (Signed)
Dr. Posey Pronto into see patient. She reports her headache is gone. She has voided twice on bedside commode. Taking po well including coca cola. He reports she can go home after two hours after checking with him.

## 2018-03-28 ENCOUNTER — Encounter: Payer: Self-pay | Admitting: Oncology

## 2018-03-28 ENCOUNTER — Other Ambulatory Visit: Payer: Self-pay

## 2018-03-28 ENCOUNTER — Inpatient Hospital Stay: Payer: BLUE CROSS/BLUE SHIELD | Attending: Oncology | Admitting: Oncology

## 2018-03-28 VITALS — BP 124/82 | HR 87 | Temp 97.6°F | Resp 18 | Ht 62.0 in | Wt 160.8 lb

## 2018-03-28 DIAGNOSIS — Z803 Family history of malignant neoplasm of breast: Secondary | ICD-10-CM

## 2018-03-28 DIAGNOSIS — D508 Other iron deficiency anemias: Secondary | ICD-10-CM | POA: Diagnosis not present

## 2018-03-28 DIAGNOSIS — D259 Leiomyoma of uterus, unspecified: Secondary | ICD-10-CM | POA: Diagnosis not present

## 2018-03-28 DIAGNOSIS — D500 Iron deficiency anemia secondary to blood loss (chronic): Secondary | ICD-10-CM

## 2018-03-28 DIAGNOSIS — N924 Excessive bleeding in the premenopausal period: Secondary | ICD-10-CM | POA: Insufficient documentation

## 2018-03-28 NOTE — Progress Notes (Signed)
Patient here for follow up. She states she has heavy menstrual cycles.

## 2018-03-30 NOTE — Progress Notes (Signed)
Hematology/Oncology Consult note [facility] Telephone:([phone] [phone]   Patient Care Team: Ellene Route as PCP - General  REFERRING PROVIDER: Dr.Ward CHIEF COMPLAINTS/REASON FOR VISIT:  Evaluation of iron deficiency anemia  HISTORY OF PRESENTING ILLNESS:  Tammy Khan is a  32 y.o.  female with PMH listed below who was referred to me for evaluation of iron deficiency anemia Reviewed patient's recent labs that was done on 03/01/2018.  Labs revealed anemia with hemoglobin of 7.6, MCV 63.  Platelet counts 458. Iron panel was obtained on 03/13/2018 at another clinic.  TIBC 583, iron saturation 2, ferritin 3. Vitamin B12 343. Reviewed patient's previous labs ordered by primary care physician's office, anemia is chronic onset , duration is since xxxx No aggravating or improving factors.  No remarkable chemistry labs.  Associated signs and symptoms: Patient reports fatigue.  Denies SOB with exertion.  Denies weight loss, easy bruising, hematochezia, hemoptysis, hematuria. Context:  History of iron deficiency:   Recently found out to have iron deficiency, started on ferrous sulfate 325 3 times daily on 03/13/2018. Rectal bleeding: Denies Menstrual bleeding/ Vaginal bleeding : Heavy menstrual bleeding Hematemesis or hemoptysis : denies Blood in urine : denies  Last endoscopy: Never had one Fatigue: Yes.  SOB: deneis  History of pseudotumor of orbit, she takes Diamox 5 mg twice daily Denies any headache, blurry vision today.  Review of Systems  Constitutional: Positive for fatigue. Negative for appetite change, chills and fever.  HENT:   Negative for hearing loss and voice change.   Eyes: Negative for eye problems.  Respiratory: Negative for chest tightness and cough.   Cardiovascular: Negative for chest pain.  Gastrointestinal: Negative for abdominal distention, abdominal pain and blood in stool.  Endocrine: Negative for hot  flashes.  Genitourinary: Negative for difficulty urinating and frequency.   Musculoskeletal: Negative for arthralgias.  Skin: Negative for itching and rash.  Neurological: Negative for extremity weakness.  Hematological: Negative for adenopathy.  Psychiatric/Behavioral: Negative for confusion.    MEDICAL HISTORY:  Past Medical History:  Diagnosis Date  . Anemia   . Fibroids   . Menorrhagia   . Pseudotumor (inflammatory) of orbit   . Tension headache     SURGICAL HISTORY: Past Surgical History:  Procedure Laterality Date  . CESAREAN SECTION  2009  . HYSTEROSCOPY W/D&C N/A 06/19/2016   Procedure: DILATATION AND CURETTAGE /HYSTEROSCOPY;  Surgeon: Honor Loh Ward, MD;  Location: [facility] ORS;  Service: Gynecology;  Laterality: N/A;    SOCIAL HISTORY: Social History   Socioeconomic History  . Marital status: Married    Spouse name: Not on file  . Number of children: 1  . Years of education: Not on file  . Highest education level: Not on file  Occupational History  . Not on file  Social Needs  . Financial resource strain: Not on file  . Food insecurity:    Worry: Not on file    Inability: Not on file  . Transportation needs:    Medical: Not on file    Non-medical: Not on file  Tobacco Use  . Smoking status: Never Smoker  . Smokeless tobacco: Never Used  Substance and Sexual Activity  . Alcohol use: No  . Drug use: No  . Sexual activity: Yes    Partners: Male    Birth control/protection: None  Lifestyle  . Physical activity:    Days per week: Not on file    Minutes per session: Not on file  . Stress: Not  on file  Relationships  . Social connections:    Talks on phone: Not on file    Gets together: Not on file    Attends religious service: Not on file    Active member of club or organization: Not on file    Attends meetings of clubs or organizations: Not on file    Relationship status: Not on file  . Intimate partner violence:    Fear of current or ex partner:  Not on file    Emotionally abused: Not on file    Physically abused: Not on file    Forced sexual activity: Not on file  Other Topics Concern  . Not on file  Social History Narrative  . Not on file    FAMILY HISTORY: Family History  Problem Relation Age of Onset  . Breast cancer Paternal Aunt 33  . Mental illness Mother   . Hypertension Mother   . Hypertension Father   . Hypertension Maternal Aunt   . Diabetes Maternal Aunt   . Diabetes Maternal Uncle   . Hypertension Maternal Uncle     ALLERGIES:  is allergic to iodine and raspberry.  MEDICATIONS:  Current Outpatient Medications  Medication Sig Dispense Refill  . acetaZOLAMIDE (DIAMOX) 500 MG capsule Take 500 mg by mouth 2 (two) times daily.    . butalbital-acetaminophen-caffeine (FIORICET, ESGIC) 50-325-40 MG tablet Take 1 tablet by mouth every 6 (six) hours as needed for headache. 15 tablet 0  . ferrous sulfate 325 (65 FE) MG tablet Take 325 mg by mouth 3 (three) times daily.    Marland Kitchen levonorgestrel-ethinyl estradiol (SEASONALE,INTROVALE,JOLESSA) 0.15-0.03 MG tablet Take 1 tablet by mouth daily.    Marland Kitchen NORTRIPTYLINE HCL PO Take 1 tablet by mouth as needed.    . naproxen (NAPROSYN) 500 MG tablet Take 1 tablet (500 mg total) by mouth 2 (two) times daily with a meal. (Patient not taking: Reported on 03/28/2018) 30 tablet 0   No current facility-administered medications for this visit.      PHYSICAL EXAMINATION: ECOG PERFORMANCE STATUS: 0 - Asymptomatic Vitals:   03/28/18 1510  BP: 124/82  Pulse: 87  Resp: 18  Temp: 97.6 F (36.4 C)  SpO2: 100%   Filed Weights   03/28/18 1510  Weight: 160 lb 12.8 oz (72.9 kg)    Physical Exam Constitutional:      General: She is not in acute distress. HENT:     Head: Normocephalic and atraumatic.  Eyes:     General: No scleral icterus.    Pupils: Pupils are equal, round, and reactive to light.  Neck:     Musculoskeletal: Normal range of motion and neck supple.    Cardiovascular:     Rate and Rhythm: Normal rate and regular rhythm.     Heart sounds: Normal heart sounds.  Pulmonary:     Effort: Pulmonary effort is normal. No respiratory distress.     Breath sounds: No wheezing.  Abdominal:     General: Bowel sounds are normal. There is no distension.     Palpations: Abdomen is soft. There is no mass.     Tenderness: There is no abdominal tenderness.  Musculoskeletal: Normal range of motion.        General: No deformity.  Skin:    General: Skin is warm and dry.     Findings: No erythema or rash.  Neurological:     Mental Status: She is alert and oriented to person, place, and time.     Cranial Nerves:  No cranial nerve deficit.     Coordination: Coordination normal.  Psychiatric:        Behavior: Behavior normal.        Thought Content: Thought content normal.      LABORATORY DATA:  I have reviewed the data as listed Lab Results  Component Value Date   WBC 6.1 03/01/2018   HGB 7.6 (L) 03/01/2018   HCT 27.8 (L) 03/01/2018   MCV 63.0 (L) 03/01/2018   PLT 458 (H) 03/01/2018   No results for input(s): NA, K, CL, CO2, GLUCOSE, BUN, CREATININE, CALCIUM, GFRNONAA, GFRAA, PROT, ALBUMIN, AST, ALT, ALKPHOS, BILITOT, BILIDIR, IBILI in the last 8760 hours. Iron/TIBC/Ferritin/ %Sat No results found for: IRON, TIBC, FERRITIN, IRONPCTSAT   RADIOGRAPHIC STUDIES: I have personally reviewed the radiological images as listed and agreed with the findings in the report. Dg Fluoro Guided Loc Of Needle/cath Tip For Spinal Inject Lt  Result Date: 03/01/2018 CLINICAL DATA:  Pseudotumor.  Headaches. EXAM: DIAGNOSTIC LUMBAR PUNCTURE UNDER FLUOROSCOPIC GUIDANCE FLUOROSCOPY TIME:  Fluoroscopy Time:  0.2 minute Radiation Exposure Index (if provided by the fluoroscopic device): 1 mGy Number of Acquired Spot Images: 0 PROCEDURE: Informed consent was obtained from the patient prior to the procedure, including potential complications of headache, allergy, and pain.  With the patient prone, the lower back was prepped with Betadine. 1% Lidocaine was used for local anesthesia. Lumbar puncture was performed at the L3-4 level using a 22 gauge needle with return of clear CSF with an opening pressure of 25 cm water. CSF was drained and the closing CSF pressure was 14 cm water. The patient tolerated the procedure well and there were no apparent complications. IMPRESSION: Successful fluoroscopic guided lumbar puncture. Electronically Signed   By: Kathreen Devoid   On: 03/01/2018 09:20   CT renal Stone study 01/31/2018 1. Negative for hydronephrosis or ureteral stone 2. Uterus is somewhat enlarged in size with suspected fibroids.  MRI Brain 02/13/2018  1. No acute intracranial process. 2. Empty sella and tortuous fluid-filled optic nerve sheaths, constellation of findings seen with intracranial hypertension. Elevated cerebral spinal fluid opening pressures would be confirmatory. 3. Mild nonspecific white matter changes, advanced for age (atypical distribution for demyelination).  ASSESSMENT & PLAN:  1. Iron deficiency anemia due to chronic blood loss   2. Family history of breast cancer   3. Excessive bleeding in premenopausal period    Reviewed patient's labs done at [facility].  Discussed with patient. Iron panel and CBC results are consistent with severe iron deficiency anemia. Most likely secondary to menorrhagia. Plan IV iron with Feraheme 510mg  weekly x 2. Allergy reactions/infusion reaction including anaphylactic reaction discussed with patient. Other side effects include but not limited to high blood pressure, skin rash, weight gain, leg swelling, etc. Patient voices understanding and willing to proceed.  #Family history of breast cancer, paternal aunt diagnosed in breast cancer in 62s.  Will refer patient to genetics for counseling.  #Menorrhagia secondary to uterine fibroids.  She follows up with GYN Dr.Wards and will be on [place]  Orders Placed  This Encounter  Procedures  . Ambulatory referral to Genetics    Referral Priority:   Routine    Referral Type:   Consultation    Referral Reason:   Specialty Services Required    Number of Visits Requested:   1    All questions were answered. The patient knows to call the clinic with any problems questions or concerns.  Return of visit: 8 weeks.  Thank  you for this kind referral and the opportunity to participate in the care of this patient. A copy of today's note is routed to referring provider  Cc Dr.Ward.   Earlie Server, MD, PhD Hematology Oncology [facility] at [facility] Pager- [phone] 03/30/2018

## 2018-03-30 NOTE — Addendum Note (Signed)
Addended by: Earlie Server on: 03/30/2018 09:17 AM   Modules accepted: Orders

## 2018-04-04 ENCOUNTER — Inpatient Hospital Stay: Payer: BLUE CROSS/BLUE SHIELD

## 2018-04-04 VITALS — BP 109/72 | HR 73 | Temp 95.8°F | Resp 18

## 2018-04-04 DIAGNOSIS — D259 Leiomyoma of uterus, unspecified: Secondary | ICD-10-CM | POA: Diagnosis not present

## 2018-04-04 DIAGNOSIS — N924 Excessive bleeding in the premenopausal period: Secondary | ICD-10-CM | POA: Diagnosis not present

## 2018-04-04 DIAGNOSIS — D500 Iron deficiency anemia secondary to blood loss (chronic): Secondary | ICD-10-CM

## 2018-04-04 DIAGNOSIS — D508 Other iron deficiency anemias: Secondary | ICD-10-CM | POA: Diagnosis not present

## 2018-04-04 DIAGNOSIS — Z803 Family history of malignant neoplasm of breast: Secondary | ICD-10-CM | POA: Diagnosis not present

## 2018-04-04 DIAGNOSIS — N920 Excessive and frequent menstruation with regular cycle: Secondary | ICD-10-CM

## 2018-04-04 MED ORDER — SODIUM CHLORIDE 0.9 % IV SOLN
Freq: Once | INTRAVENOUS | Status: AC
  Administered 2018-04-04: 14:00:00 via INTRAVENOUS
  Filled 2018-04-04: qty 250

## 2018-04-04 MED ORDER — SODIUM CHLORIDE 0.9 % IV SOLN
510.0000 mg | Freq: Once | INTRAVENOUS | Status: AC
  Administered 2018-04-04: 510 mg via INTRAVENOUS
  Filled 2018-04-04: qty 17

## 2018-04-04 NOTE — Progress Notes (Signed)
Pt tolerated infusion well. Pt and VS stable at discharge.  

## 2018-04-11 ENCOUNTER — Inpatient Hospital Stay: Payer: BLUE CROSS/BLUE SHIELD | Attending: Oncology

## 2018-04-11 VITALS — BP 103/68 | HR 74 | Temp 97.0°F | Resp 18

## 2018-04-11 DIAGNOSIS — D259 Leiomyoma of uterus, unspecified: Secondary | ICD-10-CM | POA: Diagnosis not present

## 2018-04-11 DIAGNOSIS — N920 Excessive and frequent menstruation with regular cycle: Secondary | ICD-10-CM

## 2018-04-11 DIAGNOSIS — N924 Excessive bleeding in the premenopausal period: Secondary | ICD-10-CM | POA: Insufficient documentation

## 2018-04-11 DIAGNOSIS — D508 Other iron deficiency anemias: Secondary | ICD-10-CM | POA: Insufficient documentation

## 2018-04-11 DIAGNOSIS — D500 Iron deficiency anemia secondary to blood loss (chronic): Secondary | ICD-10-CM

## 2018-04-11 MED ORDER — SODIUM CHLORIDE 0.9 % IV SOLN
510.0000 mg | Freq: Once | INTRAVENOUS | Status: AC
  Administered 2018-04-11: 510 mg via INTRAVENOUS
  Filled 2018-04-11: qty 17

## 2018-04-11 MED ORDER — SODIUM CHLORIDE 0.9 % IV SOLN
Freq: Once | INTRAVENOUS | Status: AC
  Administered 2018-04-11: 15:00:00 via INTRAVENOUS
  Filled 2018-04-11: qty 250

## 2018-05-23 ENCOUNTER — Inpatient Hospital Stay: Payer: BLUE CROSS/BLUE SHIELD | Attending: Oncology

## 2018-05-23 DIAGNOSIS — D500 Iron deficiency anemia secondary to blood loss (chronic): Secondary | ICD-10-CM | POA: Insufficient documentation

## 2018-05-23 DIAGNOSIS — Z803 Family history of malignant neoplasm of breast: Secondary | ICD-10-CM | POA: Insufficient documentation

## 2018-05-23 DIAGNOSIS — Z79899 Other long term (current) drug therapy: Secondary | ICD-10-CM | POA: Insufficient documentation

## 2018-05-23 DIAGNOSIS — N920 Excessive and frequent menstruation with regular cycle: Secondary | ICD-10-CM | POA: Diagnosis not present

## 2018-05-23 DIAGNOSIS — R5383 Other fatigue: Secondary | ICD-10-CM | POA: Insufficient documentation

## 2018-05-23 DIAGNOSIS — N924 Excessive bleeding in the premenopausal period: Secondary | ICD-10-CM

## 2018-05-23 DIAGNOSIS — Z793 Long term (current) use of hormonal contraceptives: Secondary | ICD-10-CM | POA: Diagnosis not present

## 2018-05-23 LAB — CBC WITH DIFFERENTIAL/PLATELET
Abs Immature Granulocytes: 0.02 10*3/uL (ref 0.00–0.07)
BASOS PCT: 0 %
Basophils Absolute: 0 10*3/uL (ref 0.0–0.1)
Eosinophils Absolute: 0.1 10*3/uL (ref 0.0–0.5)
Eosinophils Relative: 1 %
HCT: 40.2 % (ref 36.0–46.0)
Hemoglobin: 12.7 g/dL (ref 12.0–15.0)
Immature Granulocytes: 0 %
Lymphocytes Relative: 27 %
Lymphs Abs: 1.4 10*3/uL (ref 0.7–4.0)
MCH: 25.7 pg — AB (ref 26.0–34.0)
MCHC: 31.6 g/dL (ref 30.0–36.0)
MCV: 81.2 fL (ref 80.0–100.0)
Monocytes Absolute: 0.2 10*3/uL (ref 0.1–1.0)
Monocytes Relative: 4 %
NEUTROS PCT: 68 %
Neutro Abs: 3.4 10*3/uL (ref 1.7–7.7)
Platelets: 377 10*3/uL (ref 150–400)
RBC: 4.95 MIL/uL (ref 3.87–5.11)
WBC: 5.1 10*3/uL (ref 4.0–10.5)
nRBC: 0 % (ref 0.0–0.2)

## 2018-05-23 LAB — IRON AND TIBC
Iron: 65 ug/dL (ref 28–170)
Saturation Ratios: 21 % (ref 10.4–31.8)
TIBC: 308 ug/dL (ref 250–450)
UIBC: 243 ug/dL

## 2018-05-23 LAB — FERRITIN: Ferritin: 75 ng/mL (ref 11–307)

## 2018-05-24 ENCOUNTER — Other Ambulatory Visit: Payer: Self-pay

## 2018-05-24 ENCOUNTER — Inpatient Hospital Stay (HOSPITAL_BASED_OUTPATIENT_CLINIC_OR_DEPARTMENT_OTHER): Payer: BLUE CROSS/BLUE SHIELD | Admitting: Oncology

## 2018-05-24 ENCOUNTER — Encounter: Payer: Self-pay | Admitting: Oncology

## 2018-05-24 ENCOUNTER — Inpatient Hospital Stay: Payer: BLUE CROSS/BLUE SHIELD

## 2018-05-24 VITALS — BP 117/81 | HR 91 | Temp 97.3°F | Resp 18 | Wt 162.8 lb

## 2018-05-24 DIAGNOSIS — R5383 Other fatigue: Secondary | ICD-10-CM

## 2018-05-24 DIAGNOSIS — Z793 Long term (current) use of hormonal contraceptives: Secondary | ICD-10-CM

## 2018-05-24 DIAGNOSIS — D500 Iron deficiency anemia secondary to blood loss (chronic): Secondary | ICD-10-CM

## 2018-05-24 DIAGNOSIS — N920 Excessive and frequent menstruation with regular cycle: Secondary | ICD-10-CM

## 2018-05-24 DIAGNOSIS — Z803 Family history of malignant neoplasm of breast: Secondary | ICD-10-CM

## 2018-05-24 DIAGNOSIS — Z79899 Other long term (current) drug therapy: Secondary | ICD-10-CM

## 2018-05-24 NOTE — Progress Notes (Signed)
Patient here for follow. Complains of being tired at times.

## 2018-05-26 NOTE — Progress Notes (Signed)
Hematology/Oncology Consult note [facility] Telephone:([phone] [phone]   Patient Care Team: Ellene Route as PCP - General  REFERRING PROVIDER: Dr.Ward CHIEF COMPLAINTS/REASON FOR VISIT:  Evaluation of iron deficiency anemia  HISTORY OF PRESENTING ILLNESS:  Tammy Khan is a  33 y.o.  female with PMH listed below who was referred to me for evaluation of iron deficiency anemia Reviewed patient's recent labs that was done on 03/01/2018.  Labs revealed anemia with hemoglobin of 7.6, MCV 63.  Platelet counts 458. Iron panel was obtained on 03/13/2018 at another clinic.  TIBC 583, iron saturation 2, ferritin 3. Vitamin B12 343. Reviewed patient's previous labs ordered by primary care physician's office, anemia is chronic onset , duration is since xxxx No aggravating or improving factors.  No remarkable chemistry labs.  Associated signs and symptoms: Patient reports fatigue.  Denies SOB with exertion.  Denies weight loss, easy bruising, hematochezia, hemoptysis, hematuria. Context:  History of iron deficiency:   Recently found out to have iron deficiency, started on ferrous sulfate 325 3 times daily on 03/13/2018. Rectal bleeding: Denies Menstrual bleeding/ Vaginal bleeding : Heavy menstrual bleeding Hematemesis or hemoptysis : denies Blood in urine : denies  Last endoscopy: Never had one Fatigue: Yes.  SOB: deneis  History of pseudotumor of orbit, she takes Diamox 5 mg twice daily Denies any headache, blurry vision today.  INTERVAL HISTORY Tammy Khan is a 33 y.o. female who has above history reviewed by me today presents for follow up visit for iron deficiency anemia during the interval patient has received IV Feraheme. Reports energy has significantly improved.  A lot less fatigue now. She has been started on birth control pills.  Doing well.  Review of Systems  Constitutional: Negative for appetite change, chills, fatigue  and fever.  HENT:   Negative for hearing loss and voice change.   Eyes: Negative for eye problems.  Respiratory: Negative for chest tightness and cough.   Cardiovascular: Negative for chest pain.  Gastrointestinal: Negative for abdominal distention, abdominal pain and blood in stool.  Endocrine: Negative for hot flashes.  Genitourinary: Negative for difficulty urinating and frequency.   Musculoskeletal: Negative for arthralgias.  Skin: Negative for itching and rash.  Neurological: Negative for extremity weakness.  Hematological: Negative for adenopathy.  Psychiatric/Behavioral: Negative for confusion.    MEDICAL HISTORY:  Past Medical History:  Diagnosis Date  . Anemia   . Fibroids   . Menorrhagia   . Pseudotumor (inflammatory) of orbit   . Tension headache     SURGICAL HISTORY: Past Surgical History:  Procedure Laterality Date  . CESAREAN SECTION  2009  . HYSTEROSCOPY W/D&C N/A 06/19/2016   Procedure: DILATATION AND CURETTAGE /HYSTEROSCOPY;  Surgeon: Honor Loh Ward, MD;  Location: [facility] ORS;  Service: Gynecology;  Laterality: N/A;    SOCIAL HISTORY: Social History   Socioeconomic History  . Marital status: Married    Spouse name: Not on file  . Number of children: 1  . Years of education: Not on file  . Highest education level: Not on file  Occupational History  . Not on file  Social Needs  . Financial resource strain: Not on file  . Food insecurity:    Worry: Not on file    Inability: Not on file  . Transportation needs:    Medical: Not on file    Non-medical: Not on file  Tobacco Use  . Smoking status: Never Smoker  . Smokeless tobacco: Never Used  Substance and  Sexual Activity  . Alcohol use: No  . Drug use: No  . Sexual activity: Yes    Partners: Male    Birth control/protection: None  Lifestyle  . Physical activity:    Days per week: Not on file    Minutes per session: Not on file  . Stress: Not on file  Relationships  . Social connections:     Talks on phone: Not on file    Gets together: Not on file    Attends religious service: Not on file    Active member of club or organization: Not on file    Attends meetings of clubs or organizations: Not on file    Relationship status: Not on file  . Intimate partner violence:    Fear of current or ex partner: Not on file    Emotionally abused: Not on file    Physically abused: Not on file    Forced sexual activity: Not on file  Other Topics Concern  . Not on file  Social History Narrative  . Not on file    FAMILY HISTORY: Family History  Problem Relation Age of Onset  . Breast cancer Paternal Aunt 56  . Mental illness Mother   . Hypertension Mother   . Hypertension Father   . Hypertension Maternal Aunt   . Diabetes Maternal Aunt   . Diabetes Maternal Uncle   . Hypertension Maternal Uncle     ALLERGIES:  is allergic to iodine and raspberry.  MEDICATIONS:  Current Outpatient Medications  Medication Sig Dispense Refill  . acetaZOLAMIDE (DIAMOX) 250 MG tablet Take 2 tablets by mouth 2 (two) times daily.    . butalbital-acetaminophen-caffeine (FIORICET, ESGIC) 50-325-40 MG tablet Take 1 tablet by mouth every 6 (six) hours as needed for headache. 15 tablet 0  . ferrous sulfate 325 (65 FE) MG tablet Take 325 mg by mouth 3 (three) times daily.    Marland Kitchen levonorgestrel-ethinyl estradiol (SEASONALE,INTROVALE,JOLESSA) 0.15-0.03 MG tablet Take 1 tablet by mouth daily.    Marland Kitchen NORTRIPTYLINE HCL PO Take 10 mg by mouth as needed.     Marland Kitchen acetaZOLAMIDE (DIAMOX) 500 MG capsule Take 500 mg by mouth 2 (two) times daily.     No current facility-administered medications for this visit.      PHYSICAL EXAMINATION: ECOG PERFORMANCE STATUS: 0 - Asymptomatic Vitals:   05/24/18 1340  BP: 117/81  Pulse: 91  Resp: 18  Temp: (!) 97.3 F (36.3 C)   Filed Weights   05/24/18 1340  Weight: 162 lb 12.8 oz (73.8 kg)    Physical Exam Constitutional:      General: She is not in acute  distress. HENT:     Head: Normocephalic and atraumatic.  Eyes:     General: No scleral icterus.    Pupils: Pupils are equal, round, and reactive to light.  Neck:     Musculoskeletal: Normal range of motion and neck supple.  Cardiovascular:     Rate and Rhythm: Normal rate and regular rhythm.     Heart sounds: Normal heart sounds.  Pulmonary:     Effort: Pulmonary effort is normal. No respiratory distress.     Breath sounds: No wheezing.  Abdominal:     General: Bowel sounds are normal. There is no distension.     Palpations: Abdomen is soft. There is no mass.     Tenderness: There is no abdominal tenderness.  Musculoskeletal: Normal range of motion.        General: No deformity.  Skin:  General: Skin is warm and dry.     Findings: No erythema or rash.  Neurological:     Mental Status: She is alert and oriented to person, place, and time.     Cranial Nerves: No cranial nerve deficit.     Coordination: Coordination normal.  Psychiatric:        Behavior: Behavior normal.        Thought Content: Thought content normal.      LABORATORY DATA:  I have reviewed the data as listed Lab Results  Component Value Date   WBC 5.1 05/23/2018   HGB 12.7 05/23/2018   HCT 40.2 05/23/2018   MCV 81.2 05/23/2018   PLT 377 05/23/2018   No results for input(s): NA, K, CL, CO2, GLUCOSE, BUN, CREATININE, CALCIUM, GFRNONAA, GFRAA, PROT, ALBUMIN, AST, ALT, ALKPHOS, BILITOT, BILIDIR, IBILI in the last 8760 hours. Iron/TIBC/Ferritin/ %Sat    Component Value Date/Time   IRON 65 05/23/2018 1117   TIBC 308 05/23/2018 1117   FERRITIN 75 05/23/2018 1117   IRONPCTSAT 21 05/23/2018 1117     RADIOGRAPHIC STUDIES: I have personally reviewed the radiological images as listed and agreed with the findings in the report. No results found. CT renal Stone study 01/31/2018 1. Negative for hydronephrosis or ureteral stone 2. Uterus is somewhat enlarged in size with suspected fibroids.  MRI Brain  02/13/2018  1. No acute intracranial process. 2. Empty sella and tortuous fluid-filled optic nerve sheaths, constellation of findings seen with intracranial hypertension. Elevated cerebral spinal fluid opening pressures would be confirmatory. 3. Mild nonspecific white matter changes, advanced for age (atypical distribution for demyelination).  ASSESSMENT & PLAN:  1. Iron deficiency anemia due to chronic blood loss   2. Menorrhagia with regular cycle    Labs are reviewed and discussed with patient. Hemoglobin has normalized. Iron panel indicates improvement of iron stores. Hold additional IV iron at this point. Recommend patient to continue take oral iron supplementation as instructed. Follow-up in 1 year for MD assessment.  #Family history of breast cancer, paternal aunt diagnosed in breast cancer in 108s.  I have referred patient to genetics for counseling.   Orders Placed This Encounter  Procedures  . CBC with Differential/Platelet    Standing Status:   Future    Standing Expiration Date:   05/24/2020  . Ferritin    Standing Status:   Future    Standing Expiration Date:   05/24/2020  . Iron and TIBC    Standing Status:   Future    Standing Expiration Date:   05/24/2020    All questions were answered. The patient knows to call the clinic with any problems questions or concerns.  Return of visit: 1 year.   Earlie Server, MD, PhD Hematology Oncology [facility] Pager- [phone]

## 2018-06-28 ENCOUNTER — Telehealth: Payer: Self-pay | Admitting: Licensed Clinical Social Worker

## 2018-06-28 NOTE — Telephone Encounter (Signed)
Rescheduled genetics appointment to November 07, 2018 at 2:30 PM.

## 2018-07-04 ENCOUNTER — Inpatient Hospital Stay: Payer: BLUE CROSS/BLUE SHIELD | Admitting: Licensed Clinical Social Worker

## 2018-07-26 ENCOUNTER — Telehealth: Payer: Self-pay | Admitting: Licensed Clinical Social Worker

## 2018-08-06 NOTE — Telephone Encounter (Signed)
Called patient to offer to move her appointment up if she could do virtual genetics visit. Was unable to reach her after 3 attempts. We will keep her appointment as scheduled.

## 2018-08-30 ENCOUNTER — Encounter: Payer: Self-pay | Admitting: Emergency Medicine

## 2018-08-30 ENCOUNTER — Other Ambulatory Visit: Payer: Self-pay

## 2018-08-30 ENCOUNTER — Emergency Department: Payer: BLUE CROSS/BLUE SHIELD

## 2018-08-30 ENCOUNTER — Emergency Department
Admission: EM | Admit: 2018-08-30 | Discharge: 2018-08-30 | Disposition: A | Discharge status: Home / Self Care | Payer: BLUE CROSS/BLUE SHIELD | Attending: Emergency Medicine | Admitting: Emergency Medicine

## 2018-08-30 DIAGNOSIS — Y929 Unspecified place or not applicable: Secondary | ICD-10-CM | POA: Insufficient documentation

## 2018-08-30 DIAGNOSIS — S6991XA Unspecified injury of right wrist, hand and finger(s), initial encounter: Secondary | ICD-10-CM | POA: Diagnosis present

## 2018-08-30 DIAGNOSIS — Z79899 Other long term (current) drug therapy: Secondary | ICD-10-CM | POA: Diagnosis not present

## 2018-08-30 DIAGNOSIS — S61219A Laceration without foreign body of unspecified finger without damage to nail, initial encounter: Secondary | ICD-10-CM

## 2018-08-30 DIAGNOSIS — Y999 Unspecified external cause status: Secondary | ICD-10-CM | POA: Diagnosis not present

## 2018-08-30 DIAGNOSIS — W231XXA Caught, crushed, jammed, or pinched between stationary objects, initial encounter: Secondary | ICD-10-CM | POA: Insufficient documentation

## 2018-08-30 DIAGNOSIS — S61209A Unspecified open wound of unspecified finger without damage to nail, initial encounter: Secondary | ICD-10-CM

## 2018-08-30 DIAGNOSIS — Y939 Activity, unspecified: Secondary | ICD-10-CM | POA: Diagnosis not present

## 2018-08-30 DIAGNOSIS — S61212A Laceration without foreign body of right middle finger without damage to nail, initial encounter: Secondary | ICD-10-CM | POA: Diagnosis not present

## 2018-08-30 MED ORDER — LIDOCAINE HCL (PF) 1 % IJ SOLN
5.0000 mL | Freq: Once | INTRAMUSCULAR | Status: AC
  Administered 2018-08-30: 5 mL
  Filled 2018-08-30: qty 5

## 2018-08-30 NOTE — ED Provider Notes (Signed)
[facility] Emergency Department Provider Note  ____________________________________________   First MD Initiated Contact with Patient 08/30/18 [phone]     (approximate)  I have reviewed the triage vital signs and the nursing notes.   HISTORY  Chief Complaint Finger Injury    HPI Tammy Khan is a 33 y.o. female presents emergency department laceration to the right middle finger.  She had her finger caught in a dog cage earlier splinted.  Tdap is up-to-date.  No other complaints.    Past Medical History:  Diagnosis Date  . Anemia   . Fibroids   . Menorrhagia   . Pseudotumor (inflammatory) of orbit   . Tension headache     Patient Active Problem List   Diagnosis Date Noted  . Iron deficiency anemia due to chronic blood loss 03/28/2018  . Menorrhagia 09/27/2016    Past Surgical History:  Procedure Laterality Date  . CESAREAN SECTION  2009  . HYSTEROSCOPY W/D&C N/A 06/19/2016   Procedure: DILATATION AND CURETTAGE /HYSTEROSCOPY;  Surgeon: Honor Loh Ward, MD;  Location: [facility] ORS;  Service: Gynecology;  Laterality: N/A;    Prior to Admission medications   Medication Sig Start Date End Date Taking? Authorizing Provider  ferrous sulfate 325 (65 FE) MG tablet Take 325 mg by mouth 3 (three) times daily. 03/13/18 03/13/19  [provider]  levonorgestrel-ethinyl estradiol (SEASONALE,INTROVALE,JOLESSA) 0.15-0.03 MG tablet Take 1 tablet by mouth daily. 03/25/18   [provider]    Allergies Iodine and Raspberry  Family History  Problem Relation Age of Onset  . Breast cancer Paternal Aunt 78  . Mental illness Mother   . Hypertension Mother   . Hypertension Father   . Hypertension Maternal Aunt   . Diabetes Maternal Aunt   . Diabetes Maternal Uncle   . Hypertension Maternal Uncle     Social History Social History   Tobacco Use  . Smoking status: Never Smoker  . Smokeless tobacco: Never Used  Substance Use Topics  .  Alcohol use: No  . Drug use: No    Review of Systems  Constitutional: No fever/chills Eyes: No visual changes. ENT: No sore throat. Respiratory: Denies cough Genitourinary: Negative for dysuria. Musculoskeletal: Negative for back pain. Skin: Negative for rash.  Positive laceration to the right middle finger    ____________________________________________   PHYSICAL EXAM:  VITAL SIGNS: ED Triage Vitals  Enc Vitals Group     BP 08/30/18 0914 (!) 128/96     Pulse Rate 08/30/18 0914 (!) 106     Resp 08/30/18 0914 18     Temp 08/30/18 0914 98.5 F (36.9 C)     Temp Source 08/30/18 0914 Oral     SpO2 08/30/18 0914 97 %     Weight 08/30/18 0912 162 lb 11.2 oz (73.8 kg)     Height --      Head Circumference --      Peak Flow --      Pain Score 08/30/18 0911 6     Pain Loc --      Pain Edu? --      Excl. in [place]? --     Constitutional: Alert and oriented. Well appearing and in no acute distress. Eyes: Conjunctivae are normal.  Head: Atraumatic. Nose: No congestion/rhinnorhea. Mouth/Throat: Mucous membranes are moist.   Neck:  supple no lymphadenopathy noted Cardiovascular: Normal rate, regular rhythm.  Respiratory: Normal respiratory effort.  No retractions GU: deferred Musculoskeletal: FROM all extremities, warm and well perfused, positive  for laceration on the volar surface of the right middle finger, no foreign body or tendon involvement is noted Neurologic:  Normal speech and language.  Skin:  Skin is warm, dry, positive laceration right middle finger, no rash noted. Psychiatric: Mood and affect are normal. Speech and behavior are normal.  ____________________________________________   LABS (all labs ordered are listed, but only abnormal results are displayed)  Labs Reviewed - No data to display ____________________________________________   ____________________________________________  RADIOLOGY  X-ray right middle finger is negative for fracture or  opaque foreign body  ____________________________________________   PROCEDURES  Procedure(s) performed:  Marland KitchenMarland KitchenLaceration Repair Date/Time: 08/30/2018 10:08 AM Performed by: Versie Starks, PA-C Authorized by: Versie Starks, PA-C   Consent:    Consent obtained:  Verbal   Consent given by:  Patient   Risks discussed:  Infection, pain, poor cosmetic result, tendon damage and poor wound healing   Alternatives discussed:  No treatment Anesthesia (see MAR for exact dosages):    Anesthesia method:  Local infiltration   Local anesthetic:  Lidocaine 1% w/o epi Laceration details:    Location:  Finger   Finger location:  R long finger   Length (cm):  1.5   Depth (mm):  2 Repair type:    Repair type:  Simple Pre-procedure details:    Preparation:  Patient was prepped and draped in usual sterile fashion Exploration:    Hemostasis achieved with:  Direct pressure   Wound exploration: wound explored through full range of motion     Wound extent: no foreign bodies/material noted, no tendon damage noted and no underlying fracture noted   Treatment:    Area cleansed with:  Saline   Amount of cleaning:  Standard   Irrigation solution:  Sterile saline   Irrigation method:  Syringe and tap Skin repair:    Repair method:  Sutures   Suture size:  5-0   Suture material:  Nylon   Suture technique:  Simple interrupted   Number of sutures:  3 Approximation:    Approximation:  Close Post-procedure details:    Dressing:  Non-adherent dressing   Patient tolerance of procedure:  Tolerated well, no immediate complications      ____________________________________________   INITIAL IMPRESSION / ASSESSMENT AND PLAN / ED COURSE  Pertinent labs & imaging results that were available during my care of the patient were reviewed by me and considered in my medical decision making (see chart for details).   Patient is 33 year old female presents emergency department with complaints of laceration  to the right middle finger.  Middle finger shows a 1.5 cm laceration which is very superficial to the volar surface of the right middle finger.  No foreign body or tendon involvement is noted.  See procedure note for repair  X-ray of the right middle finger is negative for fracture.  Patient was instructed on how to care for the sutures.  She is to either see her regular doctor in 1 week or return emergency department for suture removal.  If there is a side effect but she should follow-up with her regular doctor or return here for evaluation.  She states she understands will comply.  Patient did not need a Tdap as it was less than 5 years.  She was discharged in stable condition.     As part of my medical decision making, I reviewed the following data within the [facility] notes reviewed and incorporated, Old chart reviewed, radiograph, x-ray of right middle  finger is negative, Notes from prior ED visits and [place] Controlled Substance Database  ____________________________________________   FINAL CLINICAL IMPRESSION(S) / ED DIAGNOSES  Final diagnoses:  Laceration of right middle finger without foreign body without damage to nail, initial encounter      NEW MEDICATIONS STARTED DURING THIS VISIT:  New Prescriptions   No medications on file     Note:  This document was prepared using Dragon voice recognition software and may include unintentional dictation errors.    Versie Starks, PA-C 08/30/18 1011    Lavonia Drafts, MD 08/30/18 1300

## 2018-08-30 NOTE — ED Triage Notes (Signed)
Pinched right middle finger in dog cage this morning.  Bleeding controlled.

## 2018-08-30 NOTE — ED Notes (Signed)
See triage note  Presents with injury to right index finger  States she got it caught on her dog cage

## 2018-08-30 NOTE — Discharge Instructions (Addendum)
Follow-up with your regular doctor or return emergency department in 1 week for suture removal.  Watch the area for redness pus and swelling.  If you see any sign of infection please return emergency department for evaluation.

## 2018-09-06 ENCOUNTER — Other Ambulatory Visit: Payer: Self-pay

## 2018-09-06 ENCOUNTER — Emergency Department
Admission: EM | Admit: 2018-09-06 | Discharge: 2018-09-06 | Disposition: A | Discharge status: Home / Self Care | Payer: BLUE CROSS/BLUE SHIELD | Attending: Emergency Medicine | Admitting: Emergency Medicine

## 2018-09-06 ENCOUNTER — Encounter: Payer: Self-pay | Admitting: Emergency Medicine

## 2018-09-06 DIAGNOSIS — Z79899 Other long term (current) drug therapy: Secondary | ICD-10-CM | POA: Diagnosis not present

## 2018-09-06 DIAGNOSIS — Z4802 Encounter for removal of sutures: Secondary | ICD-10-CM | POA: Diagnosis present

## 2018-09-06 DIAGNOSIS — X58XXXD Exposure to other specified factors, subsequent encounter: Secondary | ICD-10-CM | POA: Diagnosis not present

## 2018-09-06 DIAGNOSIS — S61212D Laceration without foreign body of right middle finger without damage to nail, subsequent encounter: Secondary | ICD-10-CM | POA: Diagnosis not present

## 2018-09-06 NOTE — ED Provider Notes (Signed)
[facility] Emergency Department Provider Note  ____________________________________________   First MD Initiated Contact with Patient 09/06/18 1111     (approximate)  I have reviewed the triage vital signs and the nursing notes.   HISTORY  Chief Complaint Suture / Staple Removal    HPI Tammy Khan is a 33 y.o. female here for for suture removal.  No complications with sutures.  No sign of infection.    Past Medical History:  Diagnosis Date  . Anemia   . Fibroids   . Menorrhagia   . Pseudotumor (inflammatory) of orbit   . Tension headache     Patient Active Problem List   Diagnosis Date Noted  . Iron deficiency anemia due to chronic blood loss 03/28/2018  . Menorrhagia 09/27/2016    Past Surgical History:  Procedure Laterality Date  . CESAREAN SECTION  2009  . HYSTEROSCOPY W/D&C N/A 06/19/2016   Procedure: DILATATION AND CURETTAGE /HYSTEROSCOPY;  Surgeon: Honor Loh Ward, MD;  Location: [facility] ORS;  Service: Gynecology;  Laterality: N/A;    Prior to Admission medications   Medication Sig Start Date End Date Taking? Authorizing Provider  ferrous sulfate 325 (65 FE) MG tablet Take 325 mg by mouth 3 (three) times daily. 03/13/18 03/13/19  [provider]  levonorgestrel-ethinyl estradiol (SEASONALE,INTROVALE,JOLESSA) 0.15-0.03 MG tablet Take 1 tablet by mouth daily. 03/25/18   [provider]    Allergies Iodine and Raspberry  Family History  Problem Relation Age of Onset  . Breast cancer Paternal Aunt 6  . Mental illness Mother   . Hypertension Mother   . Hypertension Father   . Hypertension Maternal Aunt   . Diabetes Maternal Aunt   . Diabetes Maternal Uncle   . Hypertension Maternal Uncle     Social History Social History   Tobacco Use  . Smoking status: Never Smoker  . Smokeless tobacco: Never Used  Substance Use Topics  . Alcohol use: No  . Drug use: No    Review of Systems  Constitutional: No  fever/chills Eyes: No visual changes. ENT: No sore throat. Respiratory: Denies cough Genitourinary: Negative for dysuria. Musculoskeletal: Negative for back pain. Skin: Negative for rash.  Here for suture removal    ____________________________________________   PHYSICAL EXAM:  VITAL SIGNS: ED Triage Vitals  Enc Vitals Group     BP 09/06/18 1115 124/87     Pulse Rate 09/06/18 1115 86     Resp 09/06/18 1115 18     Temp 09/06/18 1115 98.9 F (37.2 C)     Temp Source 09/06/18 1115 Oral     SpO2 09/06/18 1115 98 %     Weight 09/06/18 1110 162 lb 11.2 oz (73.8 kg)     Height 09/06/18 1110 5\' 5"  (1.651 m)     Head Circumference --      Peak Flow --      Pain Score 09/06/18 1110 0     Pain Loc --      Pain Edu? --      Excl. in [place]? --     Constitutional: Alert and oriented. Well appearing and in no acute distress. Eyes: Conjunctivae are normal.  Head: Atraumatic. Nose: No congestion/rhinnorhea. Mouth/Throat: Mucous membranes are moist.   Cardiovascular: Normal rate, regular rhythm.  Respiratory: Normal respiratory effort.  No retractions GU: deferred Musculoskeletal: FROM all extremities, warm and well perfused, no sign of infection at the suture site, sutures are intact, Neurologic:  Normal speech and language.  Skin:  Skin is warm, dry and intact. No rash noted. Psychiatric: Mood and affect are normal. Speech and behavior are normal.  ____________________________________________   LABS (all labs ordered are listed, but only abnormal results are displayed)  Labs Reviewed - No data to display ____________________________________________   ____________________________________________  RADIOLOGY    ____________________________________________   PROCEDURES  Procedure(s) performed: Sutures removed by nursing staff   Procedures    ____________________________________________   INITIAL IMPRESSION / ASSESSMENT AND PLAN / ED COURSE  Pertinent labs &  imaging results that were available during my care of the patient were reviewed by me and considered in my medical decision making (see chart for details).   Patient is 33 year old female presents emergency department for suture removal.  Sutured area is well approximated with no signs of infection.  Sutures were removed by nursing staff.  Patient was discharged stable condition.     As part of my medical decision making, I reviewed the following data within the [place] notes reviewed and incorporated, Old chart reviewed, Notes from prior ED visits and [place] Controlled Substance Database  ____________________________________________   FINAL CLINICAL IMPRESSION(S) / ED DIAGNOSES  Final diagnoses:  Visit for suture removal      NEW MEDICATIONS STARTED DURING THIS VISIT:  New Prescriptions   No medications on file     Note:  This document was prepared using Dragon voice recognition software and may include unintentional dictation errors.    Versie Starks, PA-C 09/06/18 Ali Molina, [place], MD 09/06/18 1430

## 2018-09-06 NOTE — Discharge Instructions (Addendum)
Follow up with your regular doctor if any problems.  Return to the ER if any sign of infection

## 2018-09-06 NOTE — ED Triage Notes (Signed)
Suture removal

## 2018-10-06 ENCOUNTER — Other Ambulatory Visit: Payer: Self-pay

## 2018-10-06 ENCOUNTER — Emergency Department
Admission: EM | Admit: 2018-10-06 | Discharge: 2018-10-06 | Disposition: A | Discharge status: Home / Self Care | Payer: BLUE CROSS/BLUE SHIELD | Attending: Emergency Medicine | Admitting: Emergency Medicine

## 2018-10-06 ENCOUNTER — Encounter: Payer: Self-pay | Admitting: Emergency Medicine

## 2018-10-06 DIAGNOSIS — Z79899 Other long term (current) drug therapy: Secondary | ICD-10-CM | POA: Insufficient documentation

## 2018-10-06 DIAGNOSIS — Y929 Unspecified place or not applicable: Secondary | ICD-10-CM | POA: Insufficient documentation

## 2018-10-06 DIAGNOSIS — W540XXA Bitten by dog, initial encounter: Secondary | ICD-10-CM | POA: Diagnosis not present

## 2018-10-06 DIAGNOSIS — S61452A Open bite of left hand, initial encounter: Secondary | ICD-10-CM

## 2018-10-06 DIAGNOSIS — Y939 Activity, unspecified: Secondary | ICD-10-CM | POA: Insufficient documentation

## 2018-10-06 DIAGNOSIS — Y999 Unspecified external cause status: Secondary | ICD-10-CM | POA: Diagnosis not present

## 2018-10-06 DIAGNOSIS — S6992XA Unspecified injury of left wrist, hand and finger(s), initial encounter: Secondary | ICD-10-CM | POA: Diagnosis present

## 2018-10-06 DIAGNOSIS — S61551A Open bite of right wrist, initial encounter: Secondary | ICD-10-CM | POA: Diagnosis not present

## 2018-10-06 MED ORDER — AMOXICILLIN-POT CLAVULANATE 875-125 MG PO TABS
1.0000 | ORAL_TABLET | Freq: Once | ORAL | Status: AC
  Administered 2018-10-06: 1 via ORAL
  Filled 2018-10-06: qty 1

## 2018-10-06 MED ORDER — IBUPROFEN 800 MG PO TABS
800.0000 mg | ORAL_TABLET | Freq: Once | ORAL | Status: AC
  Administered 2018-10-06: 800 mg via ORAL
  Filled 2018-10-06: qty 1

## 2018-10-06 MED ORDER — AMOXICILLIN-POT CLAVULANATE 875-125 MG PO TABS: 1.0000 | ORAL_TABLET | Freq: Two times a day (BID) | ORAL | 0 refills | Status: AC

## 2018-10-06 MED ORDER — IBUPROFEN 600 MG PO TABS: 600.0000 mg | ORAL_TABLET | Freq: Four times a day (QID) | ORAL | 0 refills | Status: DC | PRN

## 2018-10-06 NOTE — Discharge Instructions (Signed)
Please take Augmentin as prescribed.  Use ibuprofen as needed for pain.  Return to the ER for any increasing pain, fevers, swelling warmth redness or drainage

## 2018-10-06 NOTE — ED Notes (Signed)

## 2018-10-06 NOTE — ED Provider Notes (Signed)
[facility] EMERGENCY DEPARTMENT Provider Note   CSN: [id number] Arrival date & time: 10/06/18  1954     History   Chief Complaint Chief Complaint  Patient presents with  . Animal Bite    HPI Tammy Khan is a 33 y.o. female.  Presents to the emergency department for evaluation of dog bite to her right wrist and left index and middle finger.  Dog bite occurred yesterday by a friend's dog.  Dog's vaccinations are up-to-date and animal control was notified.  Tetanus is up-to-date.  Patient having mild pain along the dorsal aspect the left hand, only hurting to grab her seatbelt and to lift anything.  No other pain throughout the left hand or right wrist.     HPI  Past Medical History:  Diagnosis Date  . Anemia   . Fibroids   . Menorrhagia   . Pseudotumor (inflammatory) of orbit   . Tension headache     Patient Active Problem List   Diagnosis Date Noted  . Iron deficiency anemia due to chronic blood loss 03/28/2018  . Menorrhagia 09/27/2016    Past Surgical History:  Procedure Laterality Date  . CESAREAN SECTION  2009  . HYSTEROSCOPY W/D&C N/A 06/19/2016   Procedure: DILATATION AND CURETTAGE /HYSTEROSCOPY;  Surgeon: Honor Loh Ward, MD;  Location: [facility] ORS;  Service: Gynecology;  Laterality: N/A;     OB History    Gravida  1   Para  1   Term  1   Preterm      AB  0   Living  1     SAB      TAB      Ectopic      Multiple      Live Births  1            Home Medications    Prior to Admission medications   Medication Sig Start Date End Date Taking? Authorizing Provider  amoxicillin-clavulanate (AUGMENTIN) 875-125 MG tablet Take 1 tablet by mouth every 12 (twelve) hours for 5 days. 10/06/18 10/11/18  Duanne Guess, PA-C  ferrous sulfate 325 (65 FE) MG tablet Take 325 mg by mouth 3 (three) times daily. 03/13/18 03/13/19  [provider]  ibuprofen (ADVIL) 600 MG tablet Take 1 tablet (600 mg total) by mouth every 6  (six) hours as needed for moderate pain. 10/06/18   Duanne Guess, PA-C  levonorgestrel-ethinyl estradiol (SEASONALE,INTROVALE,JOLESSA) 0.15-0.03 MG tablet Take 1 tablet by mouth daily. 03/25/18   [provider]    Family History Family History  Problem Relation Age of Onset  . Breast cancer Paternal Aunt 39  . Mental illness Mother   . Hypertension Mother   . Hypertension Father   . Hypertension Maternal Aunt   . Diabetes Maternal Aunt   . Diabetes Maternal Uncle   . Hypertension Maternal Uncle     Social History Social History   Tobacco Use  . Smoking status: Never Smoker  . Smokeless tobacco: Never Used  Substance Use Topics  . Alcohol use: No  . Drug use: No     Allergies   Iodine and Raspberry   Review of Systems Review of Systems  Constitutional: Negative for fever.  Musculoskeletal: Negative for arthralgias, joint swelling, myalgias and neck pain.  Skin: Positive for wound.  Neurological: Negative for numbness.     Physical Exam Updated Vital Signs BP (!) 143/93   Pulse 89   Temp 98.4 F (36.9 C) (Oral)  Resp 18   Ht 5\' 4"  (1.626 m)   Wt 74.8 kg   SpO2 97%   BMI 28.32 kg/m   Physical Exam Constitutional:      Appearance: She is well-developed.  HENT:     Head: Normocephalic and atraumatic.  Eyes:     Conjunctiva/sclera: Conjunctivae normal.  Neck:     Musculoskeletal: Normal range of motion.  Cardiovascular:     Rate and Rhythm: Normal rate.  Pulmonary:     Effort: Pulmonary effort is normal. No respiratory distress.  Musculoskeletal: Normal range of motion.     Comments: Ulnar aspect of the right wrist with small abrasion with no joint swelling, limited range of motion.  No visible or palpable foreign body.  Able to make a full fist.  Sensation intact distally.  Left index and middle finger with superficial dorsal abrasions with no extensor tendon deficits.  She is able to make a full fist.  Sensation intact distally.  No  visible or palpable foreign body.  Small abrasions appear to be healing with no signs of any infection.  No signs of swelling throughout the left hand.  Skin:    General: Skin is warm.     Findings: No rash.  Neurological:     Mental Status: She is alert and oriented to person, place, and time.  Psychiatric:        Behavior: Behavior normal.        Thought Content: Thought content normal.      ED Treatments / Results  Labs (all labs ordered are listed, but only abnormal results are displayed) Labs Reviewed - No data to display  EKG    Radiology No results found.  Procedures Procedures (including critical care time)  Medications Ordered in ED Medications  ibuprofen (ADVIL) tablet 800 mg (has no administration in time range)  amoxicillin-clavulanate (AUGMENTIN) 875-125 MG per tablet 1 tablet (has no administration in time range)     Initial Impression / Assessment and Plan / ED Course  I have reviewed the triage vital signs and the nursing notes.  Pertinent labs & imaging results that were available during my care of the patient were reviewed by me and considered in my medical decision making (see chart for details).        33 year old female with dog bite yesterday to the left hand and right wrist.  No significant swelling or limitations in range of motion.  Patient with mild abrasions no significant puncture wounds or lacerations.  No signs of any infection.  She is placed on a prophylactic antibiotic, Augmentin.  Her tetanus is up-to-date.  Animal control has been notified, patient states dog's vaccinations are up-to-date.  She understands signs and symptoms return to the ED for.  We discussed getting x-rays today but due to minor abrasions and no visible or palpable foreign bodies as well as no significant bony tenderness we elected not to pursue x-rays today.  Final Clinical Impressions(s) / ED Diagnoses   Final diagnoses:  Dog bite, initial encounter  Dog bite of  left hand, initial encounter  Dog bite of right wrist, initial encounter    ED Discharge Orders         Ordered    ibuprofen (ADVIL) 600 MG tablet  Every 6 hours PRN     10/06/18 2117    amoxicillin-clavulanate (AUGMENTIN) 875-125 MG tablet  Every 12 hours     10/06/18 2117           Dorise Hiss  Elenora Gamma 10/06/18 2131    Arta Silence, MD 10/06/18 2348

## 2018-10-06 NOTE — ED Triage Notes (Signed)
Patient states that she was playing with a friend's dog yesterday and the dog bit her. Patient with puncture to top of left hand with some swelling. Patient with bit mark to left second finger and right wrist. Patient states that the dog's vaccinations are up to date.

## 2018-10-20 ENCOUNTER — Encounter: Payer: Self-pay | Admitting: Emergency Medicine

## 2018-10-20 ENCOUNTER — Emergency Department: Payer: BLUE CROSS/BLUE SHIELD

## 2018-10-20 ENCOUNTER — Emergency Department
Admission: EM | Admit: 2018-10-20 | Discharge: 2018-10-20 | Disposition: A | Discharge status: Home / Self Care | Payer: BLUE CROSS/BLUE SHIELD | Attending: Emergency Medicine | Admitting: Emergency Medicine

## 2018-10-20 ENCOUNTER — Other Ambulatory Visit: Payer: Self-pay

## 2018-10-20 DIAGNOSIS — N921 Excessive and frequent menstruation with irregular cycle: Secondary | ICD-10-CM | POA: Insufficient documentation

## 2018-10-20 DIAGNOSIS — N939 Abnormal uterine and vaginal bleeding, unspecified: Secondary | ICD-10-CM | POA: Diagnosis present

## 2018-10-20 DIAGNOSIS — Z79899 Other long term (current) drug therapy: Secondary | ICD-10-CM | POA: Diagnosis not present

## 2018-10-20 DIAGNOSIS — D219 Benign neoplasm of connective and other soft tissue, unspecified: Secondary | ICD-10-CM | POA: Insufficient documentation

## 2018-10-20 LAB — BASIC METABOLIC PANEL
Anion gap: 7 (ref 5–15)
BUN: 12 mg/dL (ref 6–20)
CO2: 27 mmol/L (ref 22–32)
Calcium: 8.5 mg/dL — ABNORMAL LOW (ref 8.9–10.3)
Chloride: 105 mmol/L (ref 98–111)
Creatinine, Ser: 0.64 mg/dL (ref 0.44–1.00)
GFR calc Af Amer: >60 mL/min (ref >60)
GFR calc non Af Amer: >60 mL/min (ref >60)
Glucose, Bld: 89 mg/dL (ref 70–99)
Potassium: 3.6 mmol/L (ref 3.5–5.1)
Sodium: 139 mmol/L (ref 135–145)

## 2018-10-20 LAB — CBC WITH DIFFERENTIAL/PLATELET
Abs Immature Granulocytes: 0.01 10*3/uL (ref 0.00–0.07)
Basophils Absolute: 0 10*3/uL (ref 0.0–0.1)
Basophils Relative: 0 %
Eosinophils Absolute: 0.1 10*3/uL (ref 0.0–0.5)
Eosinophils Relative: 1 %
HCT: 39.6 % (ref 36.0–46.0)
Hemoglobin: 12.8 g/dL (ref 12.0–15.0)
Immature Granulocytes: 0 %
Lymphocytes Relative: 37 %
Lymphs Abs: 2.1 10*3/uL (ref 0.7–4.0)
MCH: 27.9 pg (ref 26.0–34.0)
MCHC: 32.3 g/dL (ref 30.0–36.0)
MCV: 86.3 fL (ref 80.0–100.0)
Monocytes Absolute: 0.3 10*3/uL (ref 0.1–1.0)
Monocytes Relative: 5 %
Neutro Abs: 3.3 10*3/uL (ref 1.7–7.7)
Neutrophils Relative %: 57 %
Platelets: 332 10*3/uL (ref 150–400)
RBC: 4.59 MIL/uL (ref 3.87–5.11)
RDW: 12.8 % (ref 11.5–15.5)
WBC: 5.7 10*3/uL (ref 4.0–10.5)
nRBC: 0 % (ref 0.0–0.2)

## 2018-10-20 LAB — POCT PREGNANCY, URINE: Preg Test, Ur: NEGATIVE

## 2018-10-20 NOTE — Discharge Instructions (Signed)
Please follow up closely with obstetrics and gynecology or your primary doctor.  Return to the emergency room if your bleeding worsens, you become weak and dizzy or lightheaded, you have an episode of passing out, develop severe bleeding such as more than 1 soaked pad per hour for more than 3 straight hours, develop abdominal or pelvic pain, fevers chills or other new concerns arise.   

## 2018-10-20 NOTE — ED Triage Notes (Signed)
Pt to ED via POV stating that she is having very heavy menstrual bleeding. Pt states that she has hx/o uterine fibroids. Pt reports that she is passing large clots more than anything. Pt states that she stopped taking her birth control 2 weeks ago and has been bleeding since. Pt states that the bleeding became heavy 2-3 days ago. Pt states that she sees [facility], she has appt with them on 7/29. Pt is in NAD.

## 2018-10-20 NOTE — ED Provider Notes (Addendum)
[facility] Emergency Department Provider Note   ____________________________________________   First MD Initiated Contact with Patient 10/20/18 1103     (approximate)  I have reviewed the triage vital signs and the nursing notes.   HISTORY  Chief Complaint Vaginal Bleeding    HPI Tammy Khan is a 33 y.o. female he reports a history of previous anemia and fibroids  Patient reports that for about 5 days now she has had fairly heavy vaginal bleeding, going through a tampon about once every few hours.  She reports that this started a couple days after she started her placebo medications and her contraceptive pack.  She normally has a cycle about once every 90 days, takes 90 days of her oral contraceptive and has a 7-day break.  Normally she has some cramps and bleeding, but she reports she is had slightly more and heavier clots than normal.  She is resume starting her oral contraceptive again today as per her schedule.  Denies pregnancy.  Occasional intermittent sharp pain in her uterus which she describes as cramps and this is typical for her during her cycles  No fevers or chills.  No dysuria.  Denies pregnancy.  No chest pain or shortness of breath.  No nausea or vomiting.  No fever no exposure to [place] with gynecology in [place], has appointment towards the end of this month and reports she has 2 known fibroids, she like to know if 1 of them might be getting bigger is 1 of them the other ones known to be calcified  Past Medical History:  Diagnosis Date   Anemia    Fibroids    Menorrhagia    Pseudotumor (inflammatory) of orbit    Tension headache     Patient Active Problem List   Diagnosis Date Noted   Iron deficiency anemia due to chronic blood loss 03/28/2018   Menorrhagia 09/27/2016    Past Surgical History:  Procedure Laterality Date   CESAREAN SECTION  2009   HYSTEROSCOPY W/D&C N/A 06/19/2016   Procedure: DILATATION  AND CURETTAGE [place];  Surgeon: Honor Loh Ward, MD;  Location: [facility] ORS;  Service: Gynecology;  Laterality: N/A;    Prior to Admission medications   Medication Sig Start Date End Date Taking? Authorizing Provider  ferrous sulfate 325 (65 FE) MG tablet Take 325 mg by mouth 3 (three) times daily. 03/13/18 03/13/19  [provider]  ibuprofen (ADVIL) 600 MG tablet Take 1 tablet (600 mg total) by mouth every 6 (six) hours as needed for moderate pain. 10/06/18   Duanne Guess, PA-C  levonorgestrel-ethinyl estradiol (SEASONALE,INTROVALE,JOLESSA) 0.15-0.03 MG tablet Take 1 tablet by mouth daily. 03/25/18   [provider]    Allergies Iodine and Raspberry  Family History  Problem Relation Age of Onset   Breast cancer Paternal Aunt 45   Mental illness Mother    Hypertension Mother    Hypertension Father    Hypertension Maternal Aunt    Diabetes Maternal Aunt    Diabetes Maternal Uncle    Hypertension Maternal Uncle     Social History Social History   Tobacco Use   Smoking status: Never Smoker   Smokeless tobacco: Never Used  Substance Use Topics   Alcohol use: No   Drug use: No    Review of Systems Constitutional: No fever/chills Eyes: No visual changes. ENT: No sore throat. Cardiovascular: Denies chest pain. Respiratory: Denies shortness of breath. Gastrointestinal: No abdominal pain except occasional "cramps" not currently  having any.   Genitourinary: Negative for dysuria.  Denies vaginal discharge other than blood and clots which occur with a fairly soaked tampon about every 3 hours during the day. Musculoskeletal: Negative for back pain. Skin: Negative for rash. Neurological: Negative for headaches, areas of focal weakness or numbness.    ____________________________________________   PHYSICAL EXAM:  VITAL SIGNS: ED Triage Vitals  Enc Vitals Group     BP 10/20/18 1003 132/71     Pulse Rate 10/20/18 1001 88     Resp 10/20/18  1001 16     Temp 10/20/18 1001 99.2 F (37.3 C)     Temp Source 10/20/18 1001 Oral     SpO2 10/20/18 1001 98 %     Weight 10/20/18 0959 165 lb (74.8 kg)     Height 10/20/18 0959 5\' 4"  (1.626 m)     Head Circumference --      Peak Flow --      Pain Score 10/20/18 0958 0     Pain Loc --      Pain Edu? --      Excl. in [place]? --     Constitutional: Alert and oriented. Well appearing and in no acute distress. Eyes: Conjunctivae are normal. Head: Atraumatic. Nose: No congestion/rhinnorhea. Mouth/Throat: Mucous membranes are moist. Neck: No stridor.  Cardiovascular: Normal rate, regular rhythm. Grossly normal heart sounds.  Good peripheral circulation. Respiratory: Normal respiratory effort.  No retractions. Lungs CTAB. Gastrointestinal: Soft and nontender. No distention. Musculoskeletal: No lower extremity tenderness nor edema. Neurologic:  Normal speech and language. No gross focal neurologic deficits are appreciated.  Skin:  Skin is warm, dry and intact. No rash noted. Psychiatric: Mood and affect are normal. Speech and behavior are normal.  ____________________________________________   LABS (all labs ordered are listed, but only abnormal results are displayed)  Labs Reviewed  BASIC METABOLIC PANEL - Abnormal; Notable for the following components:      Result Value   Calcium 8.5 (*)    All other components within normal limits  CBC WITH DIFFERENTIAL/PLATELET  POC URINE PREG, ED  POCT PREGNANCY, URINE   ____________________________________________  EKG   ____________________________________________  RADIOLOGY  US Transvaginal Non-ob  Result Date: 10/20/2018 CLINICAL DATA:  Pelvic pain and vaginal bleeding for 2 weeks. Fibroids. EXAM: TRANSABDOMINAL AND TRANSVAGINAL ULTRASOUND OF PELVIS DOPPLER ULTRASOUND OF OVARIES TECHNIQUE: Both transabdominal and transvaginal ultrasound examinations of the pelvis were performed. Transabdominal technique was performed for global  imaging of the pelvis including uterus, ovaries, adnexal regions, and pelvic cul-de-sac. It was necessary to proceed with endovaginal exam following the transabdominal exam to visualize the endometrium and ovaries. Color and duplex Doppler ultrasound was utilized to evaluate blood flow to the ovaries. COMPARISON:  None. FINDINGS: Uterus Measurements: 8.4 x 8.2 x 8.0 cm = volume: 289 mL. An intramural fibroid is seen in the anterior corpus measuring 4.5 cm in maximum diameter. A calcified fibroid is seen in the posterior corpus measuring 3.2 cm in maximum diameter. Endometrium Thickness: 6 mm.  No focal abnormality visualized. Right ovary Measurements: 3.2 x 2.4 x 3.4 cm = volume: 13 mL. Normal appearance/no adnexal mass. Left ovary Measurements: 3.8 x 2.9 x 3.3 cm = volume: 19 mL. Normal appearance/no adnexal mass. Pulsed Doppler evaluation of both ovaries demonstrates normal low-resistance arterial and venous waveforms. Other findings No abnormal free fluid. IMPRESSION: Two uterine fibroids, largest measuring 4.5 cm. Normal appearance of both ovaries.  No adnexal mass identified. No sonographic evidence for ovarian torsion. Electronically Signed  By: Marlaine Hind M.D.   On: 10/20/2018 12:27   US Pelvis Complete  Result Date: 10/20/2018 CLINICAL DATA:  Pelvic pain and vaginal bleeding for 2 weeks. Fibroids. EXAM: TRANSABDOMINAL AND TRANSVAGINAL ULTRASOUND OF PELVIS DOPPLER ULTRASOUND OF OVARIES TECHNIQUE: Both transabdominal and transvaginal ultrasound examinations of the pelvis were performed. Transabdominal technique was performed for global imaging of the pelvis including uterus, ovaries, adnexal regions, and pelvic cul-de-sac. It was necessary to proceed with endovaginal exam following the transabdominal exam to visualize the endometrium and ovaries. Color and duplex Doppler ultrasound was utilized to evaluate blood flow to the ovaries. COMPARISON:  None. FINDINGS: Uterus Measurements: 8.4 x 8.2 x 8.0 cm =  volume: 289 mL. An intramural fibroid is seen in the anterior corpus measuring 4.5 cm in maximum diameter. A calcified fibroid is seen in the posterior corpus measuring 3.2 cm in maximum diameter. Endometrium Thickness: 6 mm.  No focal abnormality visualized. Right ovary Measurements: 3.2 x 2.4 x 3.4 cm = volume: 13 mL. Normal appearance/no adnexal mass. Left ovary Measurements: 3.8 x 2.9 x 3.3 cm = volume: 19 mL. Normal appearance/no adnexal mass. Pulsed Doppler evaluation of both ovaries demonstrates normal low-resistance arterial and venous waveforms. Other findings No abnormal free fluid. IMPRESSION: Two uterine fibroids, largest measuring 4.5 cm. Normal appearance of both ovaries.  No adnexal mass identified. No sonographic evidence for ovarian torsion. Electronically Signed   By: Marlaine Hind M.D.   On: 10/20/2018 12:27   [place] Art/ven Flow Abd Pelv Doppler  Result Date: 10/20/2018 CLINICAL DATA:  Pelvic pain and vaginal bleeding for 2 weeks. Fibroids. EXAM: TRANSABDOMINAL AND TRANSVAGINAL ULTRASOUND OF PELVIS DOPPLER ULTRASOUND OF OVARIES TECHNIQUE: Both transabdominal and transvaginal ultrasound examinations of the pelvis were performed. Transabdominal technique was performed for global imaging of the pelvis including uterus, ovaries, adnexal regions, and pelvic cul-de-sac. It was necessary to proceed with endovaginal exam following the transabdominal exam to visualize the endometrium and ovaries. Color and duplex Doppler ultrasound was utilized to evaluate blood flow to the ovaries. COMPARISON:  None. FINDINGS: Uterus Measurements: 8.4 x 8.2 x 8.0 cm = volume: 289 mL. An intramural fibroid is seen in the anterior corpus measuring 4.5 cm in maximum diameter. A calcified fibroid is seen in the posterior corpus measuring 3.2 cm in maximum diameter. Endometrium Thickness: 6 mm.  No focal abnormality visualized. Right ovary Measurements: 3.2 x 2.4 x 3.4 cm = volume: 13 mL. Normal appearance/no adnexal mass.  Left ovary Measurements: 3.8 x 2.9 x 3.3 cm = volume: 19 mL. Normal appearance/no adnexal mass. Pulsed Doppler evaluation of both ovaries demonstrates normal low-resistance arterial and venous waveforms. Other findings No abnormal free fluid. IMPRESSION: Two uterine fibroids, largest measuring 4.5 cm. Normal appearance of both ovaries.  No adnexal mass identified. No sonographic evidence for ovarian torsion. Electronically Signed   By: Marlaine Hind M.D.   On: 10/20/2018 12:27     ____________________________________________   PROCEDURES  Procedure(s) performed: None  Procedures  Critical Care performed: No  ____________________________________________   INITIAL IMPRESSION / ASSESSMENT AND PLAN / ED COURSE  Pertinent labs & imaging results that were available during my care of the patient were reviewed by me and considered in my medical decision making (see chart for details).   Vaginal bleeding, appears consistent with where she is on her oral contraceptive as she had 7 days of the placebo tablet, just restarted today.  She does however describe fairly heavy bleeding at times.  Currently normal hemodynamics and her hemoglobin  is normal as well.  Suspect menorrhagia, also may be contributed by fibroids.  No signs or symptoms suggest torsion, ultrasound reassuring does not demonstrate evidence of torsion but does redemonstrate known fibroids.  Not pregnant.  Tammy Khan was evaluated in Emergency Department on 10/20/2018 for the symptoms described in the history of present illness. She was evaluated in the context of the global COVID-19 pandemic, which necessitated consideration that the patient might be at risk for infection with the SARS-CoV-2 virus that causes COVID-19. Institutional protocols and algorithms that pertain to the evaluation of patients at risk for COVID-19 are in a state of rapid change based on information released by regulatory bodies including the CDC and federal and  state organizations. These policies and algorithms were followed during the patient's care in the ED.  No COVID symptoms.  ----------------------------------------- 12:55 PM on 10/20/2018 -----------------------------------------  Patient is resting comfortably in no distress.  She is comfortable with plan for discharge with careful return precautions, bleeding precautions discussed.  I anticipate she will likely have slowing of her bleeding out she is restarting her oral contraceptive this morning.  Patient awake alert no distress, will call and follow-up closely with her obstetrician in [place].  Return precautions and treatment recommendations and follow-up discussed with the patient who is agreeable with the plan.       ____________________________________________   FINAL CLINICAL IMPRESSION(S) / ED DIAGNOSES  Final diagnoses:  Menorrhagia with irregular cycle  Fibroids        Note:  This document was prepared using Dragon voice recognition software and may include unintentional dictation errors       Delman Kitten, MD 10/20/18 1255    Delman Kitten, MD 10/20/18 1256

## 2018-10-29 ENCOUNTER — Telehealth: Payer: Self-pay | Admitting: Licensed Clinical Social Worker

## 2018-10-29 NOTE — Telephone Encounter (Signed)
Spoke with Tammy Khan about her upcoming genetics appointment on 7/30. She would prefer this to be a virtual visit with [facility]. The email invitation was sent to her.

## 2018-11-06 ENCOUNTER — Telehealth: Payer: Self-pay | Admitting: Genetic Counselor

## 2018-11-07 ENCOUNTER — Encounter: Payer: Self-pay | Admitting: Genetic Counselor

## 2018-11-07 ENCOUNTER — Encounter: Payer: BLUE CROSS/BLUE SHIELD | Admitting: Licensed Clinical Social Worker

## 2018-11-07 ENCOUNTER — Inpatient Hospital Stay: Payer: BLUE CROSS/BLUE SHIELD | Attending: Oncology | Admitting: Genetic Counselor

## 2018-11-07 ENCOUNTER — Other Ambulatory Visit: Payer: Self-pay | Admitting: Genetic Counselor

## 2018-11-07 DIAGNOSIS — Z803 Family history of malignant neoplasm of breast: Secondary | ICD-10-CM | POA: Insufficient documentation

## 2018-11-07 NOTE — Progress Notes (Signed)
REFERRING PROVIDER: Earlie Server, MD [place],  [place] [address]  PRIMARY PROVIDER:  Ellene Route  PRIMARY REASON FOR VISIT:  1. Family history of breast cancer    I connected with Ms. Sabala on 11/07/2018 at 2:40 pm EDT by Webex video conference and verified that I am speaking with the correct person using two identifiers.   Patient location: car Provider location: clinic   HISTORY OF PRESENT ILLNESS:   Ms. Tammy Khan, a 33 y.o. female, was seen for a [place] cancer genetics consultation at the request of Dr. Tasia Catchings due to a family history of breast cancer.  Ms. Mutz presents to clinic today to discuss the possibility of a hereditary predisposition to cancer, genetic testing, and to further clarify her future cancer risks, as well as potential cancer risks for family members.    Ms. Falconi does not have a personal history of cancer.    CANCER HISTORY:  Oncology History   No history exists.     RISK FACTORS:  Menarche was at age 29.  First live birth at age 52.  OCP use for unknown years.  Ovaries intact: yes.  Hysterectomy: no.  Menopausal status: premenopausal.  HRT use: unknown. Colonoscopy: no. Mammogram within the last year: no. Number of breast biopsies: 0.   Past Medical History:  Diagnosis Date  . Anemia   . Family history of breast cancer   . Fibroids   . Menorrhagia   . Pseudotumor (inflammatory) of orbit   . Tension headache     Past Surgical History:  Procedure Laterality Date  . CESAREAN SECTION  2009  . HYSTEROSCOPY W/D&C N/A 06/19/2016   Procedure: DILATATION AND CURETTAGE /HYSTEROSCOPY;  Surgeon: Honor Loh Ward, MD;  Location: [facility] ORS;  Service: Gynecology;  Laterality: N/A;    Social History   Socioeconomic History  . Marital status: Married    Spouse name: Not on file  . Number of children: 1  . Years of education: Not on file  . Highest education level: Not on file  Occupational History  . Not on file  Social Needs   . Financial resource strain: Not on file  . Food insecurity    Worry: Not on file    Inability: Not on file  . Transportation needs    Medical: Not on file    Non-medical: Not on file  Tobacco Use  . Smoking status: Never Smoker  . Smokeless tobacco: Never Used  Substance and Sexual Activity  . Alcohol use: No  . Drug use: No  . Sexual activity: Not on file  Lifestyle  . Physical activity    Days per week: Not on file    Minutes per session: Not on file  . Stress: Not on file  Relationships  . Social Herbalist on phone: Not on file    Gets together: Not on file    Attends religious service: Not on file    Active member of club or organization: Not on file    Attends meetings of clubs or organizations: Not on file    Relationship status: Not on file  Other Topics Concern  . Not on file  Social History Narrative  . Not on file     FAMILY HISTORY:  We obtained a detailed, 4-generation family history.  Significant diagnoses are listed below: Family History  Problem Relation Age of Onset  . Breast cancer Paternal Aunt 58  . Mental illness Mother   . Hypertension  Mother   . Hypertension Father   . Hypertension Maternal Aunt   . Diabetes Maternal Aunt   . Diabetes Maternal Uncle   . Hypertension Maternal Uncle     Ms. Steger has one 54 year old son, Sheppard Coil. She has three maternal half-siblings and six paternal half-siblings. She has limited information about her paternal half-siblings, but there is no known cancer among her maternal siblings and their children.  Ms. Spearman mother died at age 94. She has six living maternal aunts and uncles, and one maternal uncle who died in his 07P due to complications from diabetes. Her maternal grandparents are in their 28s. There are no known diagnoses of cancer on her maternal side.  Ms. Drohan father died in his 12s. She has one paternal aunt who died in her 66s from breast cancer. She has another paternal aunt  who did not have cancer. Ms. Dubberly also has 4 other paternal aunts and uncles but she has limited information about their health. Her paternal grandparents died at older ages. There are no other known diagnoses of cancer on her paternal side.  Ms. Felix is unaware of previous family history of genetic testing for hereditary cancer risks. Patient's maternal ancestors are of [place] American and Native American descent, and paternal ancestors are of African American descent. There is no reported Ashkenazi Jewish ancestry. There is no known consanguinity.  GENETIC COUNSELING ASSESSMENT: Ms. Colon is a 33 y.o. female with a family history of breast cancer which is somewhat suggestive of a hereditary cancer syndrome and predisposition to cancer. We, therefore, discussed and recommended the following at today's visit.   DISCUSSION: We discussed that 5 - 10% of breast cancer is hereditary, with most cases associated with BRCA1/2.  There are other genes that can be associated with hereditary breast cancer syndromes.  These include ATM, CHEK2, PALB2, etc.  We discussed that testing is beneficial for several reasons including knowing about other cancer risks, identifying potential screening and risk-reduction options that may be appropriate, and to understand if other family members could be at risk for cancer and allow them to undergo genetic testing.   We reviewed the characteristics, features and inheritance patterns of hereditary cancer syndromes. We also discussed genetic testing, including the appropriate family members to test, the process of testing, insurance coverage and turn-around-time for results. We discussed the implications of a negative, positive and/or variant of uncertain significant result. Ms. Medellin state that she wanted to have as much information as possible about her cancer risk. We therefore recommended Ms. Dalporto pursue genetic testing for the [facility] Multi-Cancer panel.  The  Multi-Cancer Panel offered by [facility] includes sequencing and/or deletion duplication testing of the following 85 genes: AIP, ALK, APC, ATM, AXIN2,BAP1,  BARD1, BLM, BMPR1A, BRCA1, BRCA2, BRIP1, CASR, CDC73, CDH1, CDK4, CDKN1B, CDKN1C, CDKN2A (p14ARF), CDKN2A (p16INK4a), CEBPA, CHEK2, CTNNA1, DICER1, DIS3L2, EGFR (c.2369C>T, p.Thr790Met variant only), EPCAM (Deletion/duplication testing only), FH, FLCN, GATA2, GPC3, GREM1 (Promoter region deletion/duplication testing only), HOXB13 (c.251G>A, p.Gly84Glu), HRAS, KIT, MAX, MEN1, MET, MITF (c.952G>A, p.Glu318Lys variant only), MLH1, MSH2, MSH3, MSH6, MUTYH, NBN, NF1, NF2, NTHL1, PALB2, PDGFRA, PHOX2B, PMS2, POLD1, POLE, POT1, PRKAR1A, PTCH1, PTEN, RAD50, RAD51C, RAD51D, RB1, RECQL4, RET, RNF43, RUNX1, SDHAF2, SDHA (sequence changes only), SDHB, SDHC, SDHD, SMAD4, SMARCA4, SMARCB1, SMARCE1, STK11, SUFU, TERC, TERT, TMEM127, TP53, TSC1, TSC2, VHL, WRN and WT1.     Based on Ms. Frisby's family history of cancer, she meets medical criteria for genetic testing. Despite that she meets criteria, she may  still have an out of pocket cost. We discussed that if her out of pocket cost for testing is over $100, the laboratory will call and confirm whether she wants to proceed with testing.  If the out of pocket cost of testing is less than $100 she will be billed by the genetic testing laboratory.   We also discussed statistical models that estimate a person's lifetime risk of developing breast cancer. Results from these models may determine eligibility for high-risk breast screening if the lifetime risk is determine to be 20% or greater. Once we obtain Ms. Kildow's genetic testing results, we will utilize the Tyrer-Cuzick model to estimate her lifetime breast cancer risk, as endorsed by the [place].  PLAN: After considering the risks, benefits, and limitations, Ms. Osoria provided informed consent to pursue genetic testing and the blood sample was sent to  [facility] for analysis of the Multi-Cancer panel. Results should be available within approximately two-three weeks' time, at which point they will be disclosed by telephone to Ms. Mckeon, as will any additional recommendations warranted by these results. Ms. Tout will receive a summary of her genetic counseling visit and a copy of her results once available. This information will also be available in Epic.   Ms. Hink questions were answered to her satisfaction today. Our contact information was provided should additional questions or concerns arise. Thank you for the referral and allowing [place] to share in the care of your patient.   Clint Guy, MS Genetic Counselor [email or web address] Phone: [phone]   The patient was seen for a total of 30 minutes in face-to-face genetic counseling.  This patient was discussed with Drs. Magrinat, Lindi Adie and/or Burr Medico who agrees with the above.    _______________________________________________________________________ For Office Staff:  Number of people involved in session: 1 Was an Intern/ student involved with case: no

## 2018-11-12 ENCOUNTER — Other Ambulatory Visit: Payer: BLUE CROSS/BLUE SHIELD

## 2018-11-18 ENCOUNTER — Telehealth: Payer: Self-pay | Admitting: Genetic Counselor

## 2018-11-18 NOTE — Telephone Encounter (Signed)
LVM about rescheduling blood draw for genetic testing.

## 2019-01-13 ENCOUNTER — Telehealth: Payer: Self-pay | Admitting: Genetic Counselor

## 2019-01-13 DIAGNOSIS — Z1379 Encounter for other screening for genetic and chromosomal anomalies: Secondary | ICD-10-CM | POA: Insufficient documentation

## 2019-01-13 NOTE — Telephone Encounter (Signed)
Called to discuss genetic testing results. Ms. Fritze is not available to speak right now. I will call her again tomorrow to discuss the results.

## 2019-01-14 ENCOUNTER — Encounter: Payer: Self-pay | Admitting: Genetic Counselor

## 2019-01-14 ENCOUNTER — Ambulatory Visit: Payer: Self-pay | Admitting: Genetic Counselor

## 2019-01-14 DIAGNOSIS — Z1379 Encounter for other screening for genetic and chromosomal anomalies: Secondary | ICD-10-CM

## 2019-01-14 NOTE — Telephone Encounter (Signed)
Revealed negative genetic testing.  Discussed that we do not know why there is early-onset breast cancer in the family.  It is possible that there is a genetic cause in the family that Tammy Khan did not inherit.  It could also be due to a different gene that we are not testing, or our current technology may not be able to detect certain changes.  It will be important for her to keep in contact with genetics to keep up with whether additional testing may be needed.

## 2019-01-14 NOTE — Progress Notes (Signed)
HPI:  Tammy Khan was previously seen in the [place] clinic due to a family history of breast cancer and concerns regarding a hereditary predisposition to cancer. Please refer to our prior cancer genetics clinic note for more information regarding our discussion, assessment and recommendations, at the time. Tammy Khan recent genetic test results were disclosed to her, as were recommendations warranted by these results. These results and recommendations are discussed in more detail below.  CANCER HISTORY:  Oncology History   No history exists.    FAMILY HISTORY:  We obtained a detailed, 4-generation family history.  Significant diagnoses are listed below: Family History  Problem Relation Age of Onset  . Breast cancer Paternal Aunt 54  . Mental illness Mother   . Hypertension Mother   . Hypertension Father   . Hypertension Maternal Aunt   . Diabetes Maternal Aunt   . Diabetes Maternal Uncle   . Hypertension Maternal Uncle      Ms. Laningham has one 71 year old son, Tammy Khan. She has three maternal half-siblings and six paternal half-siblings. She has limited information about her paternal half-siblings, but there is no known cancer among her maternal siblings and their children.  Ms. Riffe mother died at age 60. She has six living maternal aunts and uncles, and one maternal uncle who died in his 93T due to complications from diabetes. Her maternal grandparents are in their 16s. There are no known diagnoses of cancer on her maternal side.  Ms. Kroner father died in his 57s. She has one paternal aunt who died in her 75s from breast cancer. She has another paternal aunt who did not have cancer. Ms. Duzan also has 59 other paternal aunts and uncles but she has limited information about their health. Her paternal grandparents died at older ages. There are no other known diagnoses of cancer on her paternal side.  Ms. Folts is unaware of previous family history of  genetic testing for hereditary cancer risks. Patient's maternal ancestors are of [place] American and Native American descent, and paternal ancestors are of African American descent. There is no reported Ashkenazi Jewish ancestry. There is no known consanguinity.  GENETIC TEST RESULTS: Genetic testing reported out on 01/13/2019 through the [facility] Multi-Cancer panel found no pathogenic variants. The Multi-Cancer Panel offered by [facility] includes sequencing and/or deletion duplication testing of the following 85 genes: AIP, ALK, APC, ATM, AXIN2,BAP1,  BARD1, BLM, BMPR1A, BRCA1, BRCA2, BRIP1, CASR, CDC73, CDH1, CDK4, CDKN1B, CDKN1C, CDKN2A (p14ARF), CDKN2A (p16INK4a), CEBPA, CHEK2, CTNNA1, DICER1, DIS3L2, EGFR (c.2369C>T, p.Thr790Met variant only), EPCAM (Deletion/duplication testing only), FH, FLCN, GATA2, GPC3, GREM1 (Promoter region deletion/duplication testing only), HOXB13 (c.251G>A, p.Gly84Glu), HRAS, KIT, MAX, MEN1, MET, MITF (c.952G>A, p.Glu318Lys variant only), MLH1, MSH2, MSH3, MSH6, MUTYH, NBN, NF1, NF2, NTHL1, PALB2, PDGFRA, PHOX2B, PMS2, POLD1, POLE, POT1, PRKAR1A, PTCH1, PTEN, RAD50, RAD51C, RAD51D, RB1, RECQL4, RET, RNF43, RUNX1, SDHAF2, SDHA (sequence changes only), SDHB, SDHC, SDHD, SMAD4, SMARCA4, SMARCB1, SMARCE1, STK11, SUFU, TERC, TERT, TMEM127, TP53, TSC1, TSC2, VHL, WRN and WT1. The test report will be scanned into EPIC and located under the Molecular Pathology section of the Results Review tab.  A portion of the result report is included below for reference.     We discussed with Tammy Khan that because current genetic testing is not perfect, it is possible there may be a gene mutation in one of these genes that current testing cannot detect, but that chance is small.  We also discussed, that there could be another gene that  has not yet been discovered, or that we have not yet tested, that is responsible for the cancer diagnoses in the family. It is also possible there is a hereditary  cause for the cancer in the family that Tammy Khan did not inherit and therefore was not identified in her testing.  Therefore, it is important to remain in touch with cancer genetics in the future so that we can continue to offer Tammy Khan the most up to date genetic testing.   ADDITIONAL GENETIC TESTING: We discussed with Tammy Khan that her genetic testing was fairly extensive.  If there are genes identified to increase cancer risk that can be analyzed in the future, we would be happy to discuss and coordinate this testing at that time.    CANCER SCREENING RECOMMENDATIONS: Tammy Khan test result is considered negative (normal).  This means that we have not identified a hereditary cause for her family history of cancer at this time. While reassuring, this does not definitively rule out a hereditary predisposition to cancer. It is still possible that there could be genetic mutations that are undetectable by current technology. There could be genetic mutations in genes that have not been tested or identified to increase cancer risk.  Therefore, it is recommended she continue to follow the cancer management and screening guidelines provided by her primary healthcare provider.  Of note, Tammy Khan should consider starting annual mammograms 10 years younger than the earliest age of breast cancer diagnosis of cancer in the family.  This would mean she should start screening this year, since she is within 10 years younger than the earliest diagnosis in her family.  An individual's cancer risk and medical management are not determined by genetic test results alone. Overall cancer risk assessment incorporates additional factors, including personal medical history, family history, and any available genetic information that may result in a personalized plan for cancer prevention and surveillance  Based on Tammy Khan's family of cancer, as well as her genetic test results, a statistical model Midwife) was  used to estimate her risk of developing breast cancer. This estimates her lifetime risk of developing breast cancer to be approximately 15.5%.  The patient's lifetime breast cancer risk is a preliminary estimate based on available information using one of several models endorsed by the [place] (ACS). The ACS recommends consideration of breast MRI screening as an adjunct to mammography for patients at high risk (defined as 20% or greater lifetime risk). A more detailed breast cancer risk assessment can be considered, if clinically indicated.    Data used for Tyrer-Cuzick model: Age: 33 y.o.  Weight: 165 lb.  Height: 5'4"  Menarche: 9  Parity: 24  Menopause: no  HRT use: never  BRCA gene: negative  Ovarian Cancer: no  Breast biopsy: never  Ashkenazi inheritance: no  Family history:  - Mother: 75, no breast/ovarian cancer, BRCA unknown  - Maternal half-sister: 44, no breast/ovarian cancer, BRCA unknown  - Maternal half-sister: 37, no breast/ovarian cancer, BRCA unknown  - Maternal aunt: 40, no breast/ovarian cancer, BRCA unknown  - Maternal aunt: 5, no breast/ovarian cancer, BRCA unknown  - Maternal grandmother: 44, no breast/ovarian cancer, BRCA unknown  - Paternal aunt: 71, breast cancer age 29, no ovarian cancer, BRCA unknown  - Paternal aunt: 31, no breast/ovarian cancer, BRCA unknown   RECOMMENDATIONS FOR FAMILY MEMBERS:  Individuals in this family might be at some increased risk of developing cancer, over the general population risk, simply due to the family history  of cancer.  We recommended women in this family have a yearly mammogram beginning at age 16, or 60 years younger than the earliest onset of cancer, an annual clinical breast exam, and perform monthly breast self-exams. Women in this family should also have a gynecological exam as recommended by their primary provider. All family members should have a colonoscopy by age 45.  It is also possible there is a  hereditary cause for the cancer in Tammy Khan's family that she did not inherit and therefore was not identified in her.  Based on Tammy Khan's family history, we recommended relatives of her paternal aunt, who was diagnosed with breast cancer in her 23s, have genetic counseling and testing. Tammy Khan will let us know if we can be of any assistance in coordinating genetic counseling and/or testing for this family member.   FOLLOW-UP: Lastly, we discussed with Tammy Khan that cancer genetics is a rapidly advancing field and it is possible that new genetic tests will be appropriate for her and/or her family members in the future. We encouraged her to remain in contact with cancer genetics on an annual basis so we can update her personal and family histories and let her know of advances in cancer genetics that may benefit this family.   Our contact number was provided. Tammy Khan questions were answered to her satisfaction, and she knows she is welcome to call us at anytime with additional questions or concerns.   Clint Guy, MS, [facility] Certified Genetic Counselor [email or web address] Phone: [phone]

## 2019-05-16 ENCOUNTER — Telehealth: Payer: Self-pay | Admitting: *Deleted

## 2019-05-16 NOTE — Telephone Encounter (Signed)
Per pt request to [facility] 05/26/19 lab/MD appts and stated that she would contact the office at a later date to R/S

## 2019-05-20 ENCOUNTER — Other Ambulatory Visit: Payer: BLUE CROSS/BLUE SHIELD

## 2019-05-26 ENCOUNTER — Other Ambulatory Visit: Payer: BLUE CROSS/BLUE SHIELD

## 2019-05-26 ENCOUNTER — Ambulatory Visit: Payer: BLUE CROSS/BLUE SHIELD | Admitting: Oncology

## 2019-06-17 IMAGING — MR MR HEAD W/O CM
11 series · 42 of 48 positions shown · non-contrast
Comparison: None.

CLINICAL DATA: Tension headaches for 3 months. Follow-up
papilledema.

EXAM:
MRI HEAD WITHOUT CONTRAST
TECHNIQUE: Multiplanar, multiecho pulse sequences of the brain and surrounding
structures were obtained without intravenous contrast.

[Series 5: ax dwi_tracew · axial · 3.0mm · 0.60mm/px · z∈[-74,+88]mm · 5 of 55 slices shown]
[im 1/55]
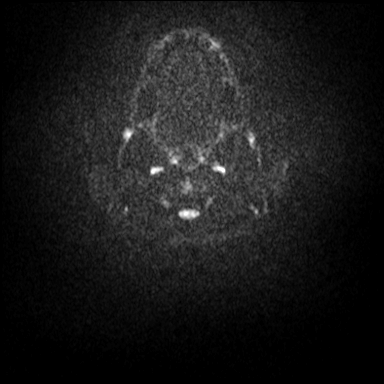
[im 14/55]
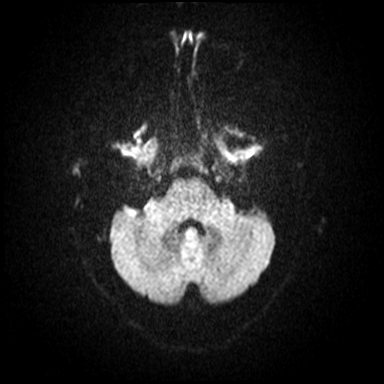
[im 28/55]
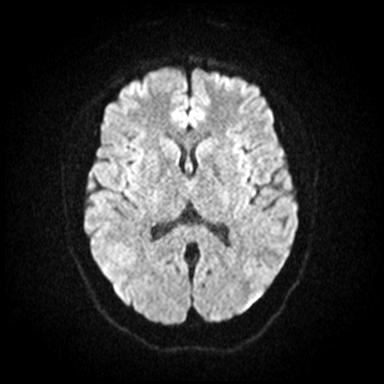
[im 41/55]
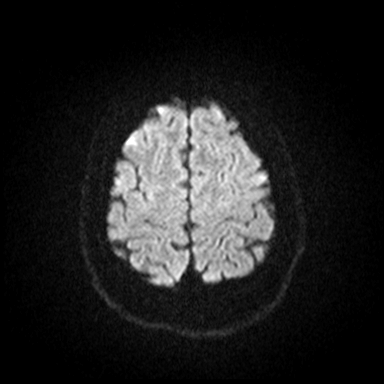
[im 55/55]
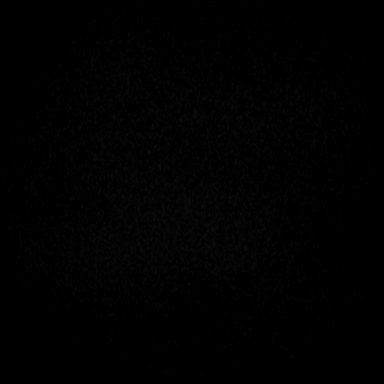

[Series 6: ax dwi_adc · axial · 3.0mm · 0.60mm/px · z∈[-74,+85]mm · 4 of 54 slices shown]
[im 1/54]
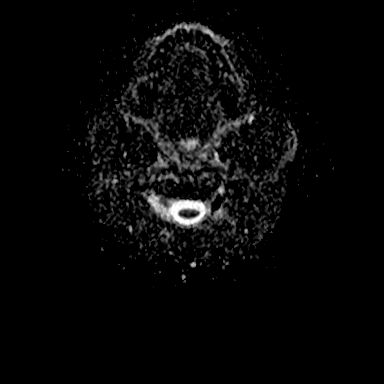
[im 18/54]
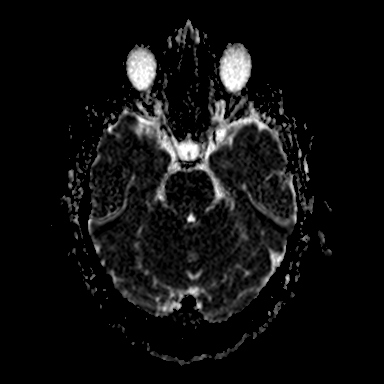
[im 36/54]
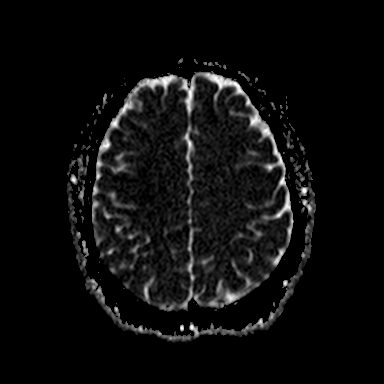
[im 54/54]
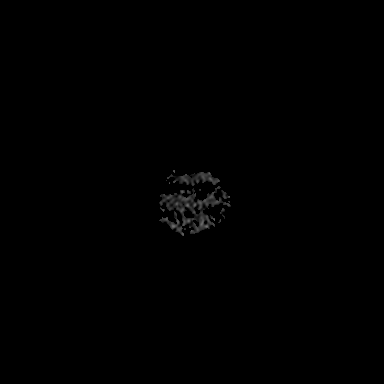

[Series 7: cor dwi_tracew · coronal · 5.0mm · 0.60mm/px · 3 of 39 slices shown]
[im 1/39]
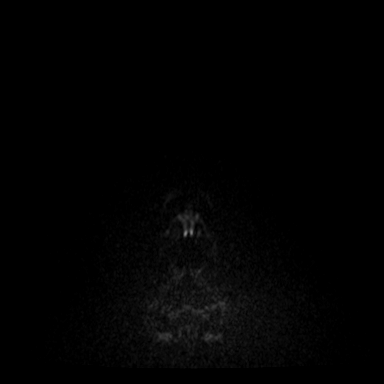
[im 20/39]
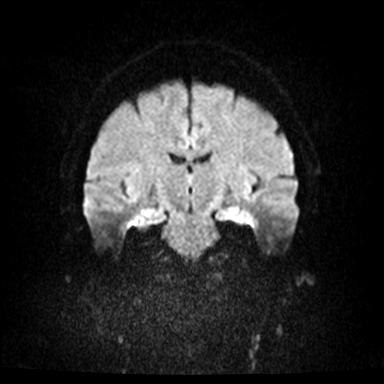
[im 39/39]
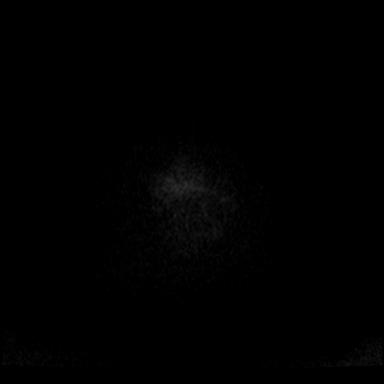

[Series 8: cor dwi_adc · coronal · 5.0mm · 0.60mm/px · 3 of 39 slices shown]
[im 1/39]
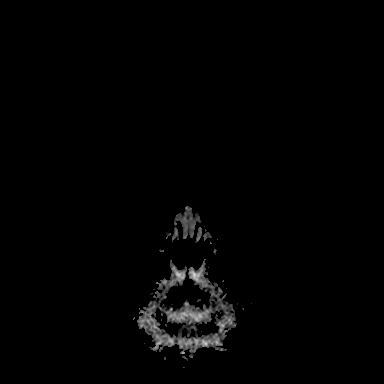
[im 20/39]
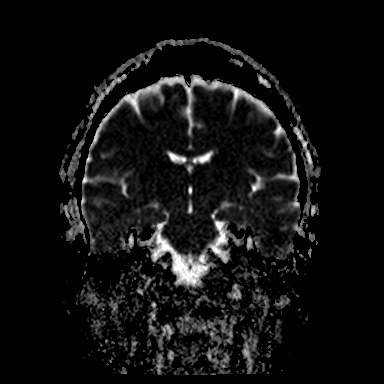
[im 39/39]
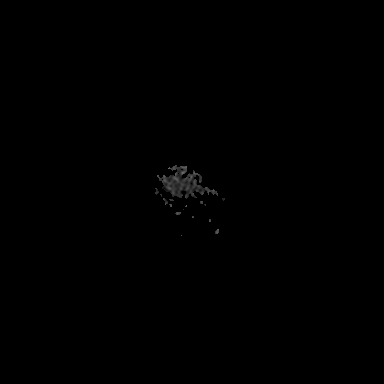

[Series 9: T1 · sagittal · 5.0mm · 0.62mm/px · 2 of 24 slices shown (1 of 2)]
[im 1/24]
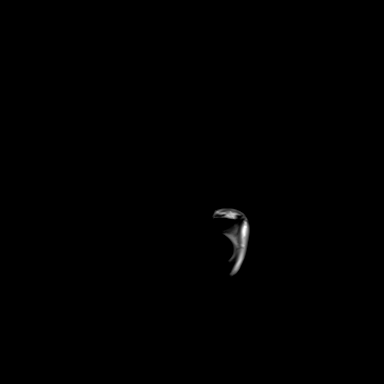
[im 24/24]
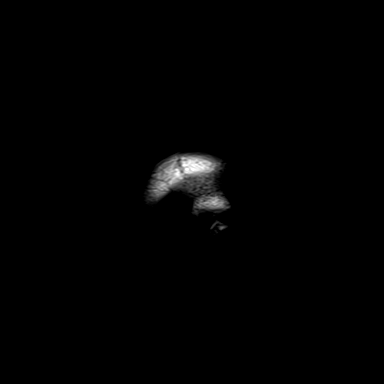

[Series 10: T2 · axial · 5.0mm · 0.53mm/px · z∈[-65,+79]mm · 2 of 25 slices shown (1 of 2)]
[im 1/25]
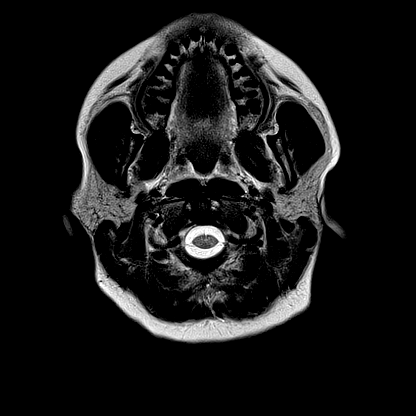
[im 25/25]
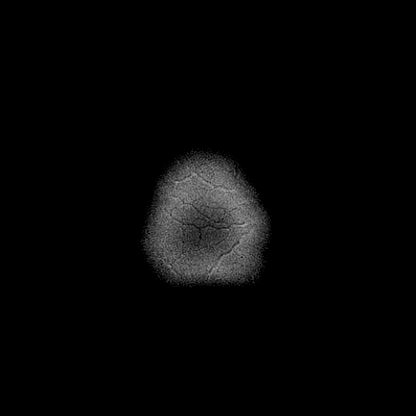

[Series 11: swi_images · axial · 3.0mm · 0.90mm/px · z∈[-81,+96]mm · 5 of 60 slices shown]
[im 1/60]
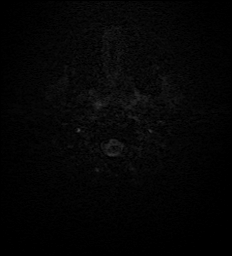
[im 15/60]
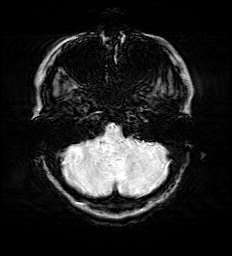
[im 30/60]
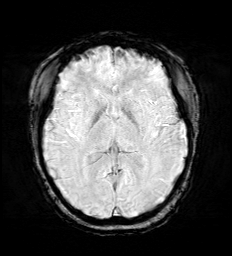
[im 45/60]
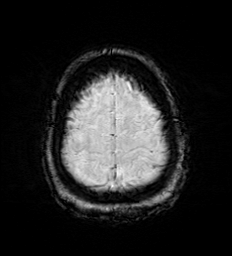
[im 60/60]
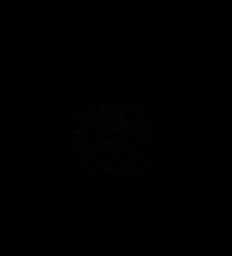

[Series 12: mip_images(sw) · axial · 24.0mm · 0.90mm/px · z∈[-70,+85]mm · 4 of 53 slices shown]
[im 1/53]
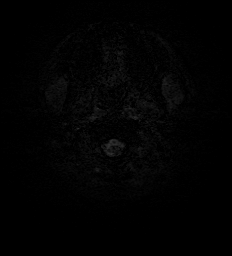
[im 18/53]
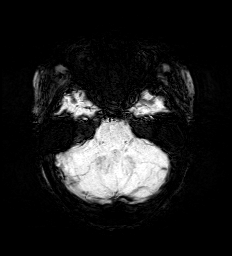
[im 35/53]
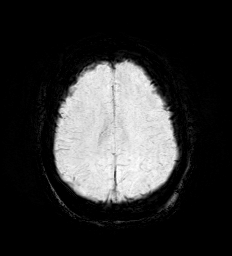
[im 53/53]
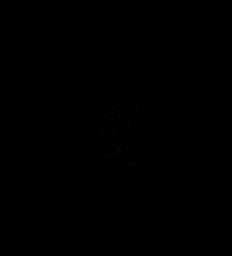

[Series 13: FLAIR · axial · 3.0mm · 0.53mm/px · z∈[-74,+88]mm · 4 of 55 slices shown]
[im 1/55]
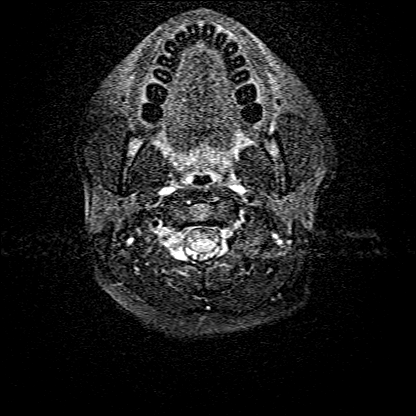
[im 19/55]
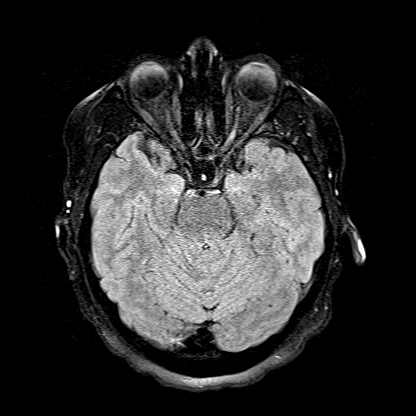
[im 37/55]
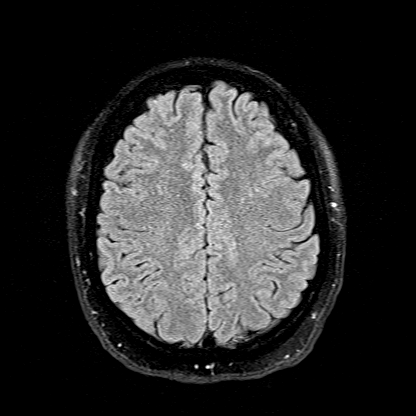
[im 55/55]
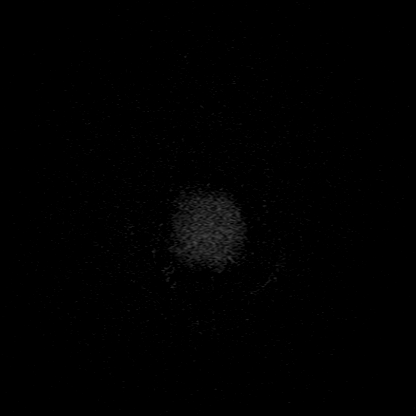

[Series 14: T1 · axial · 1.0mm · 0.98mm/px · z∈[-82,+93]mm · 8 of 176 slices shown (2 of 2)]
[im 1/176]
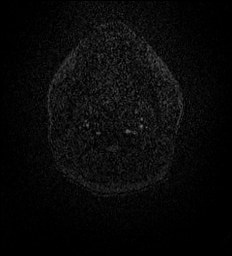
[im 27/176]
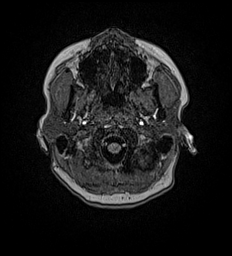
[im 54/176]
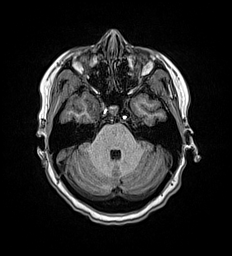
[im 81/176]
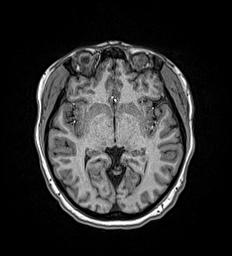
[im 95/176]
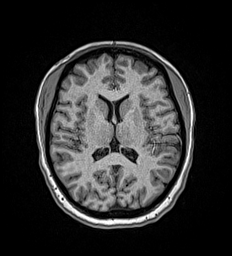
[im 122/176]
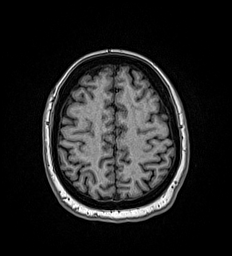
[im 149/176]
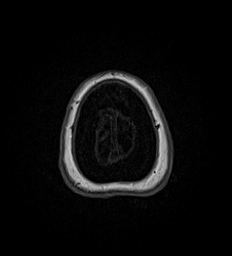
[im 176/176]
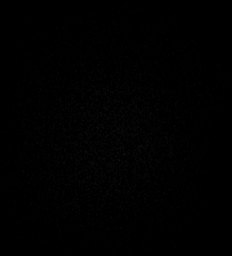

[Series 15: T2 · coronal · 5.0mm · 0.57mm/px · 2 of 29 slices shown (2 of 2)]
[im 1/29]
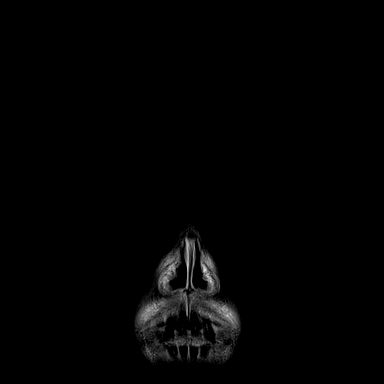
[im 29/29]
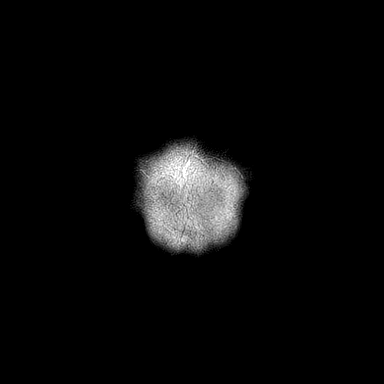

[42 of 48 positions shown; findings below may reference images not displayed]

FINDINGS: INTRACRANIAL CONTENTS: No reduced diffusion to suggest acute
ischemia or hyperacute demyelination. No susceptibility artifact to
suggest hemorrhage. The ventricles and sulci are normal for
patient's age. Scattered subcentimeter supratentorial white matter
FLAIR T2 hyperintensities in a nonspecific distribution. No
suspicious parenchymal signal, masses, mass effect. No abnormal
extra-axial fluid collections. No extra-axial masses.

VASCULAR: Normal major intracranial vascular flow voids present at
skull base.

SKULL AND UPPER CERVICAL SPINE: Mildly expanded fluid-filled empty
sella. No suspicious calvarial bone marrow signal. Craniocervical
junction maintained.

SINUSES/ORBITS: The mastoid air-cells and included paranasal sinuses
are well-aerated.Mildly patulous optic nerve sheath with tortuous
course and slight flattening of the optic discs. Mildly patulous
Meckel's caves.

OTHER: None.
IMPRESSION: 1. No acute intracranial process.
2. Empty sella and tortuous fluid-filled optic nerve sheaths,
constellation of findings seen with intracranial hypertension.
Elevated cerebral spinal fluid opening pressures would be
confirmatory.
3. Mild nonspecific white matter changes, advanced for age (atypical
distribution for demyelination).

## 2019-08-06 ENCOUNTER — Other Ambulatory Visit: Payer: Self-pay | Admitting: Obstetrics and Gynecology

## 2019-08-06 DIAGNOSIS — D259 Leiomyoma of uterus, unspecified: Secondary | ICD-10-CM

## 2019-08-10 ENCOUNTER — Emergency Department
Admission: EM | Admit: 2019-08-10 | Discharge: 2019-08-10 | Disposition: A | Discharge status: Other Acute Inpatient Hospital | Payer: BLUE CROSS/BLUE SHIELD | Attending: Emergency Medicine | Admitting: Emergency Medicine

## 2019-08-10 ENCOUNTER — Emergency Department: Payer: BLUE CROSS/BLUE SHIELD

## 2019-08-10 ENCOUNTER — Other Ambulatory Visit: Payer: Self-pay

## 2019-08-10 ENCOUNTER — Encounter: Payer: Self-pay | Admitting: Radiology

## 2019-08-10 DIAGNOSIS — Z20822 Contact with and (suspected) exposure to covid-19: Secondary | ICD-10-CM | POA: Diagnosis not present

## 2019-08-10 DIAGNOSIS — Z79899 Other long term (current) drug therapy: Secondary | ICD-10-CM | POA: Insufficient documentation

## 2019-08-10 DIAGNOSIS — I614 Nontraumatic intracerebral hemorrhage in cerebellum: Secondary | ICD-10-CM | POA: Insufficient documentation

## 2019-08-10 DIAGNOSIS — R519 Headache, unspecified: Secondary | ICD-10-CM | POA: Diagnosis present

## 2019-08-10 LAB — DIFFERENTIAL
Abs Immature Granulocytes: 0.01 10*3/uL (ref 0.00–0.07)
Basophils Absolute: 0 10*3/uL (ref 0.0–0.1)
Basophils Relative: 0 %
Eosinophils Absolute: 0 10*3/uL (ref 0.0–0.5)
Eosinophils Relative: 0 %
Immature Granulocytes: 0 %
Lymphocytes Relative: 32 %
Lymphs Abs: 2.6 10*3/uL (ref 0.7–4.0)
Monocytes Absolute: 0.4 10*3/uL (ref 0.1–1.0)
Monocytes Relative: 5 %
Neutro Abs: 5.1 10*3/uL (ref 1.7–7.7)
Neutrophils Relative %: 63 %

## 2019-08-10 LAB — COMPREHENSIVE METABOLIC PANEL
ALT: 9 U/L (ref 0–44)
AST: 15 U/L (ref 15–41)
Albumin: 3.8 g/dL (ref 3.5–5.0)
Alkaline Phosphatase: 69 U/L (ref 38–126)
Anion gap: 8 (ref 5–15)
BUN: 13 mg/dL (ref 6–20)
CO2: 25 mmol/L (ref 22–32)
Calcium: 8.8 mg/dL — ABNORMAL LOW (ref 8.9–10.3)
Chloride: 101 mmol/L (ref 98–111)
Creatinine, Ser: 0.81 mg/dL (ref 0.44–1.00)
GFR calc Af Amer: >60 mL/min (ref >60)
GFR calc non Af Amer: >60 mL/min (ref >60)
Glucose, Bld: 147 mg/dL — ABNORMAL HIGH (ref 70–99)
Potassium: 3.1 mmol/L — ABNORMAL LOW (ref 3.5–5.1)
Sodium: 134 mmol/L — ABNORMAL LOW (ref 135–145)
Total Bilirubin: 0.5 mg/dL (ref 0.3–1.2)
Total Protein: 7.6 g/dL (ref 6.5–8.1)

## 2019-08-10 LAB — CBC
HCT: 39.1 % (ref 36.0–46.0)
Hemoglobin: 12.8 g/dL (ref 12.0–15.0)
MCH: 26.5 pg (ref 26.0–34.0)
MCHC: 32.7 g/dL (ref 30.0–36.0)
MCV: 81 fL (ref 80.0–100.0)
Platelets: 327 10*3/uL (ref 150–400)
RBC: 4.83 MIL/uL (ref 3.87–5.11)
RDW: 14.7 % (ref 11.5–15.5)
WBC: 8.1 10*3/uL (ref 4.0–10.5)
nRBC: 0 % (ref 0.0–0.2)

## 2019-08-10 LAB — RESPIRATORY PANEL BY RT PCR (FLU A&B, COVID)
Influenza A by PCR: NEGATIVE
Influenza B by PCR: NEGATIVE
SARS Coronavirus 2 by RT PCR: NEGATIVE

## 2019-08-10 LAB — PROTIME-INR
INR: 0.9 (ref 0.8–1.2)
Prothrombin Time: 12.1 seconds (ref 11.4–15.2)

## 2019-08-10 LAB — GLUCOSE, CAPILLARY: Glucose-Capillary: 132 mg/dL — ABNORMAL HIGH (ref 70–99)

## 2019-08-10 LAB — APTT: aPTT: 27 seconds (ref 24–36)

## 2019-08-10 MED ORDER — NICARDIPINE HCL IN NACL 20-0.86 MG/200ML-% IV SOLN
3.0000 mg/h | INTRAVENOUS | Status: DC
  Administered 2019-08-10: 3 mg/h via INTRAVENOUS
  Filled 2019-08-10: qty 200

## 2019-08-10 MED ORDER — IOHEXOL 350 MG/ML SOLN
75.0000 mL | Freq: Once | INTRAVENOUS | Status: AC | PRN
  Administered 2019-08-10: 75 mL via INTRAVENOUS

## 2019-08-10 MED ORDER — SODIUM CHLORIDE 0.9% FLUSH
3.0000 mL | Freq: Once | INTRAVENOUS | Status: AC
  Administered 2019-08-10: 3 mL via INTRAVENOUS

## 2019-08-10 NOTE — ED Notes (Signed)
Dr Rodrigo Ran, neurologist on the tele-monitor at this time.

## 2019-08-10 NOTE — ED Notes (Signed)
Patient returned from [place].  Patient placed back on monitoring equipment.  Stroke scale done.  Patient reports headache is better at this time rated 2/10.  Patient is alert and oriented.  Roderic Palau [facility] at bedside to update patient.

## 2019-08-10 NOTE — Consult Note (Signed)
[facility] TeleNeurology Consult Services   TeleStroke Metrics: LKW: [phone] Door Time: [id number]  [facility] Contacted: [id number] [facility] at Bedside: 1801 NIHSS: 1808 mRS: 0  Decision on Alteplase: Not to give due to her acute right anterior cerebellar hemorrhage. Interventional Candidate: Not a candidate as her symptoms are not consistent with a large vessel proximal occlusion.   Chief Complaint: Acute right-sided headache, right facial numbness, and dizziness   HPI: Asked to see this patient in emergent telemedicine consultation utilizing interactive audio and video technologies. Consultation was performed with assistance of ancillary / medical staff at bedside. Verbal consent to perform the examination with telemedicine was obtained. Patient agreed to proceed with the consultation for acute stroke protocol.  34 year old right-handed African-American female who was brought to the emergency room after having acute onset of right-sided headache, right facial numbness, and dizziness while at work this evening.  Patient does not take any blood thinners or aspirin.  She is not on any birth control pills.    She denies any history for migraines.  Patient states that she currently follows up with Dr. Melrose Nakayama of [facility] neurology for a history of pseudotumor cerebri.  She last saw Dr. Melrose Nakayama in April 2021.  The patient's pseudotumor headaches are described more as a dull ache and tension type headaches.  She does note some peripheral vision problems with these pseudotumor headaches.  She notes that strong odors can trigger her headaches.  She gets intermittent headaches about once a week.  She had a MRI brain back in November 2019 that showed no acute intracranial process, but did note findings for possible intracranial hypertension.  She also had a lumbar puncture also in November 2019 that showed a CSF opening pressure of 25 cm of water.  She was noted to have no optic nerve changes.  She also  sees ophthalmology.  Patient denies any significant past medical history.  She also has fibroids and menorrhagia.  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  Then at 1715, she noted an acute sharp headache above her right eyebrow.  She described a 10/10 on pain scale with associated photophobia.  No nausea.  She also developed right facial numbness.  She had to sit down.  Then when she stood back up she felt very off balance and dizzy.  Currently, she reports a 6/10 headache.  She denies any current numbness or tingling.  She states her dizziness is better.  Head CT showed a possible acute right anterior cerebellar hemorrhage.  Blood pressure and blood sugars are stable.    PMH: Pseudotumor cerebri and fibroids with menorrhagia   SOC: Negative x3.  Patient is married and lives with her husband and son.   [place]: Negative for strokes, brain aneurysms, or intracranial hemorrhages.  Significant for hypertension.   ROS: 13 point review systems were reviewed with the patient, and are all negative with the exception of the aforementioned in the history of present illness.   VS: Temperature 98.7 C, pulse 93, blood pressure 150/102, oxygen saturation 98% on room air   Exam: Patient is in no apparent distress.  Patient appears as stated age.  No obvious acute respiratory or cardiac distress.  Patient is well groomed and well-nourished. 1a- LOC: Keenly responsive - 0 1b- LOC questions: Answers both questions correctly - 0 1c- LOC commands- Performs both tasks correctly- 0 2- Gaze: Normal; no gaze paresis or gaze deviation - 0 3- Visual Fields: normal, no Visual field deficit - 0 4- Facial movements:  no facial palsy - 0 5- Upper limb motor - no arm drift - 0 6- Lower limb motor - no leg drift - 0 7- Limb Coordination: absent ataxia - 0 8- Sensory: no sensory loss - 0 9- Language - No aphasia - 0 10- Speech - No dysarthria -0 11- Neglect / Extinction - none found - 0 NIHSS score: 0    Diagnostic Data: CT head showed a round hyperdensity that could possibly represent an acute intracranial hemorrhage within the right anterior lateral cerebellum near the middle cerebellar peduncle.  No significant mass-effect or midline shift.  Blood glucose 132, WBC 8.1, hemoglobin 12.8, platelets 327   Medical Data Reviewed: 1.Data?reviewed include clinical labs, radiology,?and medical tests; 2.Tests?results discussed w/performing or interpreting physician; 3.Obtaining/reviewing old medical records; 4.Obtaining?case history from another source; 5.Independent?review of image, tracing, or specimen.   Medical Decision Making: - Extensive number of diagnosis or management options are considered below. - Extensive amount of complex data reviewed. - High risk of complication and/or morbidity or mortality are associated with differential diagnostic considerations below. - There may be?uncertain?outcome and increased probability of prolonged functional impairment or high probability of severe prolonged functional impairment associated with some of these differential diagnosis.   Differential Diagnosis for Stroke: 1.?Cardioembolic?stroke 2. Small vessel disease/lacune 3. Thromboembolic, artery-to-artery mechanism 4.?Hypercoagulable?state-related infarct 5. Transient ischemic attack 6. Thrombotic mechanism, large artery disease   Assessment: 1.  Acute small right anterior cerebellar hemorrhage 2.  History of pseudotumor cerebri 3.  History of fibroids and menorrhagia  Recommendations: Can check a CTA of the head and neck now to better evaluate her intracranial and extracranial blood vessels, and to rule out underlying vascular malformations. Patient should be transferred to a tertiary center that can offer emergent inpatient neurology and neurosurgical evaluations. Can also check a stat MRI brain with and without contrast as well as MR venogram of the head as well to further evaluate  potential causes of her right cerebellar hemorrhage. Maintain systolic blood pressure to be less than 150. Check hemoglobin A1c, lipid panel, and urine drug screen Consult PT and OT when the patient is stable Continue supportive care Plan of care was discussed with the patient  Thank you for allowing [facility] to participate in the care of your patient. Please call me, Dr. Dale Reinbeck, with any questions at [phone]. Case discussed with the ER staff and Dr. Ellender Hose.   Critical Care notation:   I was called to see this critical patient emergently. I personally evaluated this critical patient for acute stroke evaluation, and determining their eligibility for IV Alteplase and interventional therapies.  I have spent approximately 14 minutes with the patient, including time at bedside, time discussing the case with other physicians, reviewing plan of care, and time independently reviewing the records and scans.

## 2019-08-10 NOTE — ED Notes (Signed)
Patient continues to report pain of 2/10 and denies any numbness or tingling to face.

## 2019-08-10 NOTE — ED Notes (Signed)
Pt was at work and at 1715 experienced sudden HA, Rt sided tingling of face, and dizziness. Pt states HA initial 10/10, and dizziness was "off balance" and had to sit down. Pt had hx of psuedotumor 2019.

## 2019-08-10 NOTE — ED Notes (Signed)
ED Provider at bedside. Dr Ellender Hose updating pt's husband.

## 2019-08-10 NOTE — ED Notes (Signed)
Update called to [facility] and given to [facility] about starting Nicardipine drip and about wait for transport.  Informed him would call with another update when transport was here and ready to leave.

## 2019-08-10 NOTE — ED Notes (Signed)
Report called to [facility] and given to [facility].  No awaiting to arrange transport.

## 2019-08-10 NOTE — ED Notes (Signed)
ED Provider at bedside. 

## 2019-08-10 NOTE — ED Notes (Signed)
Patient still in CT.

## 2019-08-10 NOTE — ED Notes (Signed)
Patient resting quietly at this time.

## 2019-08-10 NOTE — ED Notes (Signed)
[place] started at bedside.

## 2019-08-10 NOTE — ED Triage Notes (Signed)
Pt to ED via POV c/o Severe HA, dizziness, and tingling on the right side of her face that started at 1715 today. Pt recently diagnosed with pseudotumor. Pt does not have slurred speech. Pt ambulatory to triage without difficulty. Pt is in NAD.  Last known well: Today [phone]

## 2019-08-10 NOTE — ED Triage Notes (Signed)
FIRST NURSE NOTE- pt here for right face numbness, headache, and dizziness.  Pulled next for triage.

## 2019-08-10 NOTE — ED Notes (Signed)
Nuerologist ended assessment.

## 2019-08-10 NOTE — ED Notes (Signed)
[facility] and Medical Necessity documentation reviewed at this time and found to be complete per policy; electronic signature obtained for consent to transfer.

## 2019-08-10 NOTE — ED Provider Notes (Signed)
[facility] Provider Note  ____________________________________________  Time seen: Approximately 6:21 PM  I have reviewed the triage vital signs and the nursing notes.   HISTORY  Chief Complaint Headache, Dizziness, and Tingling    HPI Tammy Khan is a 34 y.o. female who presents the emergency department complaining of sudden, sharp, severe right-sided headache.  Patient states that she had a very sudden onset of a level 10 headache.  Patient states that it was on the right side of her head she felt dizzy, and had tingling of the right side and face.  Patient denies any trauma to the head.  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  Patient takes Diamox, Effexor for her headaches.  She states that she was concerned today as the headache was a very different nature than her typical headache.  This occurred much more rapidly, much more severe with associated dizziness and numbness and tingling of the right side.  Patient presented for evaluation of a atypical headache for her.  Patient has had again no recent trauma.  No bleeding or clotting disorders.  She states that she has had no loss of consciousness, no nausea or emesis.  Patient denies any neck pain or stiffness, chest pain, shortness of breath, abdominal pain, nausea or vomiting.  No URI symptoms.         Past Medical History:  Diagnosis Date  . Anemia   . Family history of breast cancer   . Fibroids   . Menorrhagia   . Pseudotumor (inflammatory) of orbit   . Tension headache     Patient Active Problem List   Diagnosis Date Noted  . Genetic testing 01/13/2019  . Family history of breast cancer   . Iron deficiency anemia due to chronic blood loss 03/28/2018  . Menorrhagia 09/27/2016    Past Surgical History:  Procedure Laterality Date  . CESAREAN SECTION  2009  . HYSTEROSCOPY WITH D & C N/A 06/19/2016   Procedure: DILATATION AND  CURETTAGE /HYSTEROSCOPY;  Surgeon: Honor Loh Ward, MD;  Location: [facility] ORS;  Service: Gynecology;  Laterality: N/A;    Prior to Admission medications   Medication Sig Start Date End Date Taking? Authorizing Provider  cholecalciferol (VITAMIN D3) 25 MCG (1000 UNIT) tablet Take 1,000 Units by mouth daily.   Yes [provider]  ORIAHNN 300-1-0.5 & 300 MG CPPK Take by mouth in the morning and at bedtime.  08/08/19  Yes [provider]  vitamin C (ASCORBIC ACID) 250 MG tablet Take 250 mg by mouth daily.   Yes [provider]  zinc sulfate (ZINC-220) 220 (50 Zn) MG capsule Take 220 mg by mouth daily.   Yes [provider]    Allergies Iodine and Raspberry  Family History  Problem Relation Age of Onset  . Breast cancer Paternal Aunt 80  . Mental illness Mother   . Hypertension Mother   . Hypertension Father   . Hypertension Maternal Aunt   . Diabetes Maternal Aunt   . Diabetes Maternal Uncle   . Hypertension Maternal Uncle     Social History Social History   Tobacco Use  . Smoking status: Never Smoker  . Smokeless tobacco: Never Used  Substance Use Topics  . Alcohol use: No  . Drug use: No     Review of Systems  Constitutional: No fever/chills Eyes: No visual changes. No discharge ENT: No upper respiratory complaints. Cardiovascular: no chest pain. Respiratory:  no cough. No SOB. Gastrointestinal: No abdominal pain.  No nausea, no vomiting.  No diarrhea.  No constipation. Genitourinary: Negative for dysuria. No hematuria Musculoskeletal: Negative for musculoskeletal pain. Skin: Negative for rash, abrasions, lacerations, ecchymosis. Neurological: Sudden onset of severe right-sided headache.  Patient has associated numbness and tingling of the right side of body including the right face.  Patient denies any associated focal weakness. 10-point ROS otherwise negative.  ____________________________________________   PHYSICAL EXAM:  VITAL  SIGNS: ED Triage Vitals  Enc Vitals Group     BP 08/10/19 1757 (!) 145/103     Pulse Rate 08/10/19 1757 90     Resp 08/10/19 1757 17     Temp 08/10/19 1757 98.7 F (37.1 C)     Temp Source 08/10/19 1757 Oral     SpO2 08/10/19 1757 98 %     Weight 08/10/19 1800 182 lb 15.7 oz (83 kg)     Height 08/10/19 1800 5\' 4"  (1.626 m)     Head Circumference --      Peak Flow --      Pain Score 08/10/19 1800 6     Pain Loc --      Pain Edu? --      Excl. in [place]? --      Constitutional: Alert and oriented. Well appearing and in no acute distress. Eyes: Conjunctivae are normal. PERRL. EOMI. Head: Atraumatic.  No visible signs of trauma. ENT:      Ears:       Nose: No congestion/rhinnorhea.      Mouth/Throat: Mucous membranes are moist.  Neck: No stridor.  No cervical spine tenderness to palpation Hematological/Lymphatic/Immunilogical: No cervical lymphadenopathy. Cardiovascular: Normal rate, regular rhythm. Normal S1 and S2.  Good peripheral circulation. Respiratory: Normal respiratory effort without tachypnea or retractions. Lungs CTAB. Good air entry to the bases with no decreased or absent breath sounds. Musculoskeletal: Full range of motion to all extremities. No gross deformities appreciated. Neurologic:  Normal speech and language. No gross focal neurologic deficits are appreciated.  Cranial nerves II through XII grossly intact.  Equal grip strength bilaterally.  Negative Romberg's and pronator drift. Skin:  Skin is warm, dry and intact. No rash noted. Psychiatric: Mood and affect are normal. Speech and behavior are normal. Patient exhibits appropriate insight and judgement.   ____________________________________________   LABS (all labs ordered are listed, but only abnormal results are displayed)  Labs Reviewed  GLUCOSE, CAPILLARY - Abnormal; Notable for the following components:      Result Value   Glucose-Capillary 132 (*)    All other components within normal limits   COMPREHENSIVE METABOLIC PANEL - Abnormal; Notable for the following components:   Sodium 134 (*)    Potassium 3.1 (*)    Glucose, Bld 147 (*)    Calcium 8.8 (*)    All other components within normal limits  RESPIRATORY PANEL BY RT PCR (FLU A&B, COVID)  PROTIME-INR  APTT  CBC  DIFFERENTIAL  CBG MONITORING, ED  I-STAT CREATININE, ED  POC URINE PREG, ED   ____________________________________________  EKG   ____________________________________________  RADIOLOGY I personally viewed and evaluated these images as part of my medical decision making, as well as reviewing the written report by the radiologist.  CT Angio Head W or Wo Contrast  Addendum Date: 08/10/2019   ADDENDUM REPORT: 08/10/2019 19:37 ADDENDUM: Faint area of hypoattenuation is unchanged and does not show clear contrast enhancement. There is no nearby vascular malformation. This could be a small cavernous  angioma or incidental protrusion of the choroid plexus through the right foramen of Luschka. MRI with susceptibility weighted imaging may be helpful for better characterization. Electronically Signed   By: Ulyses Jarred M.D.   On: 08/10/2019 19:37   Result Date: 08/10/2019 CLINICAL DATA:  Right facial numbness EXAM: CT ANGIOGRAPHY HEAD AND NECK TECHNIQUE: Multidetector CT imaging of the head and neck was performed using the standard protocol during bolus administration of intravenous contrast. Multiplanar CT image reconstructions and MIPs were obtained to evaluate the vascular anatomy. Carotid stenosis measurements (when applicable) are obtained utilizing NASCET criteria, using the distal internal carotid diameter as the denominator. CONTRAST:  35mL OMNIPAQUE IOHEXOL 350 MG/ML SOLN COMPARISON:  None. FINDINGS: CTA NECK FINDINGS SKELETON: There is no bony spinal canal stenosis. No lytic or blastic lesion. OTHER NECK: Normal pharynx, larynx and major salivary glands. No cervical lymphadenopathy. Unremarkable thyroid gland. UPPER  CHEST: No pneumothorax or pleural effusion. No nodules or masses. AORTIC ARCH: There is a calcific atherosclerosis of the aortic arch. There is no aneurysm, dissection or hemodynamically significant stenosis of the visualized portion of the aorta. Conventional 3 vessel aortic branching pattern. The visualized proximal subclavian arteries are widely patent. RIGHT CAROTID SYSTEM: Normal without aneurysm, dissection or stenosis. LEFT CAROTID SYSTEM: Normal without aneurysm, dissection or stenosis. VERTEBRAL ARTERIES: Left dominant configuration. Both origins are clearly patent. There is no dissection, occlusion or flow-limiting stenosis to the skull base (V1-V3 segments). CTA HEAD FINDINGS POSTERIOR CIRCULATION: --Vertebral arteries: Normal V4 segments. --Posterior inferior cerebellar arteries (PICA): Patent origins from the vertebral arteries. --Anterior inferior cerebellar arteries (AICA): Patent origins from the basilar artery. --Basilar artery: Normal. --Superior cerebellar arteries: Normal. --Posterior cerebral arteries: Normal. Both originate from the basilar artery. Posterior communicating arteries (p-comm) are diminutive or absent. ANTERIOR CIRCULATION: --Intracranial internal carotid arteries: Normal. --Anterior cerebral arteries (ACA): Normal. Both A1 segments are present. Patent anterior communicating artery (a-comm). --Middle cerebral arteries (MCA): Normal. VENOUS SINUSES: Please see separate report for CT venogram. ANATOMIC VARIANTS: None Review of the MIP images confirms the above findings. IMPRESSION: Normal CTA of the head and neck. Electronically Signed: By: Ulyses Jarred M.D. On: 08/10/2019 19:23   CT Angio Neck W and/or Wo Contrast  Addendum Date: 08/10/2019   ADDENDUM REPORT: 08/10/2019 19:37 ADDENDUM: Faint area of hypoattenuation is unchanged and does not show clear contrast enhancement. There is no nearby vascular malformation. This could be a small cavernous angioma or incidental protrusion  of the choroid plexus through the right foramen of Luschka. MRI with susceptibility weighted imaging may be helpful for better characterization. Electronically Signed   By: Ulyses Jarred M.D.   On: 08/10/2019 19:37   Result Date: 08/10/2019 CLINICAL DATA:  Right facial numbness EXAM: CT ANGIOGRAPHY HEAD AND NECK TECHNIQUE: Multidetector CT imaging of the head and neck was performed using the standard protocol during bolus administration of intravenous contrast. Multiplanar CT image reconstructions and MIPs were obtained to evaluate the vascular anatomy. Carotid stenosis measurements (when applicable) are obtained utilizing NASCET criteria, using the distal internal carotid diameter as the denominator. CONTRAST:  54mL OMNIPAQUE IOHEXOL 350 MG/ML SOLN COMPARISON:  None. FINDINGS: CTA NECK FINDINGS SKELETON: There is no bony spinal canal stenosis. No lytic or blastic lesion. OTHER NECK: Normal pharynx, larynx and major salivary glands. No cervical lymphadenopathy. Unremarkable thyroid gland. UPPER CHEST: No pneumothorax or pleural effusion. No nodules or masses. AORTIC ARCH: There is a calcific atherosclerosis of the aortic arch. There is no aneurysm, dissection or hemodynamically significant stenosis of  the visualized portion of the aorta. Conventional 3 vessel aortic branching pattern. The visualized proximal subclavian arteries are widely patent. RIGHT CAROTID SYSTEM: Normal without aneurysm, dissection or stenosis. LEFT CAROTID SYSTEM: Normal without aneurysm, dissection or stenosis. VERTEBRAL ARTERIES: Left dominant configuration. Both origins are clearly patent. There is no dissection, occlusion or flow-limiting stenosis to the skull base (V1-V3 segments). CTA HEAD FINDINGS POSTERIOR CIRCULATION: --Vertebral arteries: Normal V4 segments. --Posterior inferior cerebellar arteries (PICA): Patent origins from the vertebral arteries. --Anterior inferior cerebellar arteries (AICA): Patent origins from the basilar  artery. --Basilar artery: Normal. --Superior cerebellar arteries: Normal. --Posterior cerebral arteries: Normal. Both originate from the basilar artery. Posterior communicating arteries (p-comm) are diminutive or absent. ANTERIOR CIRCULATION: --Intracranial internal carotid arteries: Normal. --Anterior cerebral arteries (ACA): Normal. Both A1 segments are present. Patent anterior communicating artery (a-comm). --Middle cerebral arteries (MCA): Normal. VENOUS SINUSES: Please see separate report for CT venogram. ANATOMIC VARIANTS: None Review of the MIP images confirms the above findings. IMPRESSION: Normal CTA of the head and neck. Electronically Signed: By: Ulyses Jarred M.D. On: 08/10/2019 19:23   CT VENOGRAM HEAD  Result Date: 08/10/2019 CLINICAL DATA:  Right facial numbness, headache and dizziness. EXAM: CT VENOGRAM HEAD TECHNIQUE: Vena graphic images of the brain were obtained following administration of intravenous contrast agent. Multiplanar reformats and maximum intensity projections were constructed. CONTRAST:  76mL OMNIPAQUE IOHEXOL 350 MG/ML SOLN COMPARISON:  Head CT 08/10/2019 FINDINGS: Superior sagittal sinus: Normal. Straight sinus: Normal. Inferior sagittal sinus, vein of Galen and internal cerebral veins: Normal. Transverse sinuses: Filling defect at the junction of the right transverse and sigmoid sinuses likely an arachnoid granulation, as it is also hypodense on the noncontrast portion of the study. Otherwise normal. Sigmoid sinuses: Normal. Visualized jugular veins: Normal. IMPRESSION: Normal CT venogram. Electronically Signed   By: Ulyses Jarred M.D.   On: 08/10/2019 19:32   CT HEAD CODE STROKE WO CONTRAST  Result Date: 08/10/2019 CLINICAL DATA:  Code stroke.  Headache and dizziness EXAM: CT HEAD WITHOUT CONTRAST TECHNIQUE: Contiguous axial images were obtained from the base of the skull through the vertex without intravenous contrast. COMPARISON:  MRI head 02/13/2018 FINDINGS: Brain: 10 mm  focus of increased density anterior right cerebellum just below the tentorium. This is most likely an area of acute hemorrhage. No other area of acute hemorrhage. No acute ischemic infarct or mass. No edema or midline shift. Ventricle size normal. Enlargement of the sella with a small pituitary unchanged from the prior study. This can be seen with intracranial hypertension. Vascular: Negative for hyperdense vessel Skull: Negative Sinuses/Orbits: Negative Other: Negative ASPECTS ([place] Stroke Program Early CT Score) -aspects does not apply due to intracranial hemorrhage. IMPRESSION: 1. 10 mm focus of increased attenuation in the right anterior cerebellum just below the tentorium. This has the appearance of acute hemorrhage. CTA head recommended to rule out vascular malformation. MRI brain without with contrast also recommended. 2. Enlargement of the sella with small pituitary which could be seen with intracranial hypertension. 3. These results were called by telephone at the time of interpretation on 08/10/2019 at 6:04 pm to provider Betha Loa PA, who verbally acknowledged these results. Electronically Signed   By: Franchot Gallo M.D.   On: 08/10/2019 18:06    ____________________________________________    PROCEDURES  Procedure(s) performed:    Procedures    Medications  nicardipine (CARDENE) 20mg  in 0.86% saline 259ml IV infusion (0.1 mg/ml) (5 mg/hr Intravenous Rate/Dose Change 08/10/19 2033)  sodium chloride flush (NS) 0.9 %  injection 3 mL (3 mLs Intravenous Given 08/10/19 1941)  iohexol (OMNIPAQUE) 350 MG/ML injection 75 mL (75 mLs Intravenous Contrast Given 08/10/19 1850)     ____________________________________________   INITIAL IMPRESSION / ASSESSMENT AND PLAN / ED COURSE  Pertinent labs & imaging results that were available during my care of the patient were reviewed by me and considered in my medical decision making (see chart for details).  Review of the [place] CSRS was  performed in accordance of the [place] prior to dispensing any controlled drugs.  Clinical Course as of Aug 10 2110  Tammy Khan Aug 10, 2019  [id number] Patient presented to emergency department with sudden onset right-sided headache with associated numbness and tingling of the right side including the right face.  Patient has a history of pseudotumor cerebri and is being followed by [facility] neurology at [facility].  Patient states that this headache is of very different pattern than her typical headache.  She takes Diamox, Effexor for her chronic headaches.  She states that today she had a sudden onset of a severe right-sided headache.  She states that the location, severity, associated symptoms of dizziness and numbness and tingling is very atypical of her usual headache.  She presents to the emergency department for evaluation.  Patient was neurologically intact with cranial nerve testing, Romberg's and pronator drift as well as grip strength being reassuring.  Initial CT scan revealed hemorrhage in the right cerebellum.  This measures 10 mm.  Patient was hemodynamically stable on initial evaluation.  Given the fact that patient is followed by [facility] neurology, I have reached out to [facility] neurosurgery for transfer.   [JC]  [id number] Patient presented to emergency department with reassuring vital signs.  Initial blood pressure was 145/103.  Reevaluation reveals a blood pressures increasing to 158/110.  Will discuss with neurosurgery BP goals.   [JC]  [id number] I talked with Dr. Martinique with [facility].  Patient will be direct admitted to neuro ICU at [facility].  Dr. Desma Maxim is the accepting physician.  Patient's blood pressure was rising here in the emergency department and blood pressure goals were discussed.  [facility] neurosurgery would like patient systolic less than 0000000 mmHg.  They recommend starting the patient on a nicardipine drip at this time.   [JC]  1948 Patient's blood pressure had dropped into the Q000111Q systolic after  return from her CT angio.  At this time we held the nicardipine.  Patient's blood pressure started to creep upwards and when it reached a level of Q000111Q systolic we initiated nicardipine drip.   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  Patient presented to the emergency department complaining of acute atypical headache on the right side with associated facial numbness and right-sided body numbness.  Patient has a history of pseudotumor cerebri and is followed by [facility] neurology.  Patient states that today she had a sudden onset of significant atypical headache.  The pattern was different and she presented to the emergency department for evaluation.  While she has had numbness she has had no weakness to either side of the body.  Overall patient was neurologically intact on physical exam.  Patient was alert, oriented able to answer all questions appropriately.  Again no deficits on neuro exam were appreciated.  Initial imaging was concerning for cerebellar hemorrhage.  Given this finding it was recommended that patient have further evaluation  with a CT angiogram to ensure no underlying aneurysm.  There does not appear to be aneurysm on imaging and there is no change from initial imaging to second imaging in regards to sizing of hemorrhage.  At this time I discussed the patient with [facility] neurosurgery for transfer given the fact that the patient is a [facility] patient already.  Neurosurgery advises that they will accept the patient to the neuro ICU.  Patient's blood pressure had been trending upwards and it was recommended that patient be placed on nicardipine drip.  After returning from her CT angio, patient had a blood pressure of Q000111Q systolic.  We held nicardipine drip initially the patient's blood pressure started trending upwards and nicardipine drip was eventually started.  Patient has remained neurologically  intact..  At this time, patient is accepted to [facility] in transfer.  Ground transport will be delayed somewhat, and I do not feel that the patient is significantly critical at this time to require air transportation.  If patient's condition changes we will reevaluate at that time.  However, at this time we will wait for ground transport which will be roughly 3 to 4 hours.  Pending actual transfer, patient care was transferred to Dr. Ellender Hose for ongoing management until ground transportation arrives    ____________________________________________  FINAL CLINICAL IMPRESSION(S) / ED DIAGNOSES  Final diagnoses:  Hemorrhage of cerebellum ([place])      NEW MEDICATIONS STARTED DURING THIS VISIT:  ED Discharge Orders    None          This chart was dictated using voice recognition software/Dragon. Despite best efforts to proofread, errors can occur which can change the meaning. Any change was purely unintentional.    Darletta Moll, PA-C 08/10/19 2112    Duffy Bruce, MD 08/11/19 [id number]    Duffy Bruce, MD 08/21/19 1428

## 2019-08-12 MED ORDER — ACETAMINOPHEN 325 MG PO TABS
650.00 | ORAL_TABLET | ORAL | Status: DC
Start: ? — End: 2019-08-12

## 2019-08-12 MED ORDER — POLYETHYLENE GLYCOL 3350 17 GM/SCOOP PO POWD
17.00 | ORAL | Status: DC
Start: 2019-08-14 — End: 2019-08-12

## 2019-08-12 MED ORDER — HEPARIN SODIUM (PORCINE) 5000 UNIT/ML IJ SOLN
5000.00 | INTRAMUSCULAR | Status: DC
Start: 2019-08-13 — End: 2019-08-12

## 2019-08-12 MED ORDER — FAMOTIDINE 20 MG PO TABS
20.00 | ORAL_TABLET | ORAL | Status: DC
Start: 2019-08-13 — End: 2019-08-12

## 2019-08-12 MED ORDER — ONDANSETRON HCL 4 MG/2ML IJ SOLN
4.00 | INTRAMUSCULAR | Status: DC
Start: ? — End: 2019-08-12

## 2019-08-12 MED ORDER — SODIUM CHLORIDE 0.9 % IV SOLN
INTRAVENOUS | Status: DC
Start: ? — End: 2019-08-12

## 2019-08-12 MED ORDER — GENERIC EXTERNAL MEDICATION
Status: DC
Start: ? — End: 2019-08-12

## 2019-08-12 MED ORDER — GLUCAGON (RDNA) 1 MG IJ KIT
1.00 | PACK | INTRAMUSCULAR | Status: DC
Start: ? — End: 2019-08-12

## 2019-08-12 MED ORDER — DEXTROSE 50 % IV SOLN
12.50 | INTRAVENOUS | Status: DC
Start: ? — End: 2019-08-12

## 2019-08-12 MED ORDER — HYDRALAZINE HCL 20 MG/ML IJ SOLN
10.00 | INTRAMUSCULAR | Status: DC
Start: ? — End: 2019-08-12

## 2019-08-12 MED ORDER — INSULIN REGULAR HUMAN 100 UNIT/ML IJ SOLN
0.00 | INTRAMUSCULAR | Status: DC
Start: 2019-08-13 — End: 2019-08-12

## 2019-08-12 MED ORDER — LABETALOL HCL 5 MG/ML IV SOLN
10.00 | INTRAVENOUS | Status: DC
Start: ? — End: 2019-08-12

## 2019-08-13 ENCOUNTER — Telehealth: Payer: Self-pay | Admitting: Interventional Radiology

## 2019-08-13 ENCOUNTER — Ambulatory Visit
Admission: RE | Admit: 2019-08-13 | Discharge: 2019-08-13 | Disposition: A | Discharge status: Home / Self Care | Payer: BLUE CROSS/BLUE SHIELD | Source: Ambulatory Visit | Attending: Obstetrics and Gynecology | Admitting: Obstetrics and Gynecology

## 2019-08-13 ENCOUNTER — Ambulatory Visit: Payer: BLUE CROSS/BLUE SHIELD | Admitting: Dietician

## 2019-08-13 ENCOUNTER — Other Ambulatory Visit: Payer: Self-pay

## 2019-08-13 DIAGNOSIS — D259 Leiomyoma of uterus, unspecified: Secondary | ICD-10-CM

## 2019-08-13 NOTE — Telephone Encounter (Signed)
Attempted to call patient for scheduled telemedicine consultation for symptomatic uterine fibroids this morning however the patient did not answer.    Chart review demonstrates patient presented to the [facility] emergency department on 08/10/2018 with intracranial hemorrhage and subsequently transferred to the [facility] neuro ICU and therefore is likely unavailable for consultation at this time.  As such, patient will be rescheduled for [place] consultation either by myself or one of my interventional radiology partners in the next 4 to 6 weeks.  Ronny Bacon, MD Pager #: [phone]

## 2019-08-15 MED ORDER — VENLAFAXINE HCL 75 MG PO TABS
75.00 | ORAL_TABLET | ORAL | Status: DC
Start: 2019-08-14 — End: 2019-08-15

## 2019-09-17 ENCOUNTER — Encounter: Payer: BLUE CROSS/BLUE SHIELD | Attending: Neurology | Admitting: Dietician

## 2019-09-17 ENCOUNTER — Other Ambulatory Visit: Payer: Self-pay

## 2019-09-17 ENCOUNTER — Encounter: Payer: Self-pay | Admitting: Dietician

## 2019-09-17 VITALS — Ht 64.0 in | Wt 172.6 lb

## 2019-09-17 DIAGNOSIS — E663 Overweight: Secondary | ICD-10-CM | POA: Diagnosis not present

## 2019-09-17 NOTE — Patient Instructions (Signed)
   If you want to try a meal planning app, check Yummly, Mealime, FoodPrint, Pepperplate, or Spoonacular as popular free options. Not all of these will be able to customize healthy options, but could be a start especially if you know how to modify recipes to reduce fat or sugar.   Plan to eat at least a healthy snack or light meal every 3-5 hours during the day. OK to have v8 juice or a lactose free protein shake such as Fairlife or Owyn (vegan) in place of 1 meal a day if needed.   Use Mediterranean diet outline to emphasize plenty of veggies and fruits, some whole grains, and healthy low fat proteins like chicken, fish, some nuts and seeds, some low fat cheese.

## 2019-09-17 NOTE — Progress Notes (Signed)
Medical Nutrition Therapy: Visit start time: 1330  end time: 1430  Assessment:  Diagnosis: obesity Past medical history: pseudotumor cerebri, hedaches Psychosocial issues/ stress concerns: patient reports not dealing well with stress  Preferred learning method:  Tammy Khan . Hands-on   Current weight: 172.6lbs Height: 5'4" Medications, supplements: reconciled list in medical record  Progress and evaluation:   Patient reports recent weight of 180lbs; she has recently increased water intake, fewer sodas.    Wanted to try Mediterranean diet or keto to benefit pseudotumors.   Sometimes skips meals, usually not hungry in am; sometimes misses dinner due to lack of hunger.   Exercise is difficult due to fatigue after work (mostly standing job) and caring for child at home.  Physical activity: none currently  Dietary Intake:  Usual eating pattern includes 1-3 meals and 1-2 snacks per day. Dining out frequency: 2 meals per week.  Breakfast: v8 juice, sometimes coffee. Usually no solid food Snack: sometimes snack cakes, pie, rarely chips Lunch: salad from [employer] or chicken sandwich from [facility] no fries; occ fish sandwich at [employer]: none or same as am Supper: usually cooks -- steak, watermelon; chicken salad with crackers, limits bread, rice Snack: tries to avoid late snacking; occ late night chips if hungry Beverages: water, coffee with flavored creamer no other sugar, sparkling water -- likes [employer] [place]  Nutrition Care Education: Topics covered:  Basic nutrition: basic food groups, appropriate nutrient balance, appropriate meal and snack schedule, general nutrition guidelines    Weight control: importance of low sugar and low fat choices, appropriate food portions; basic meal planning using plate method; eating at regular intervals; tracking food intake Advanced nutrition:  recipe modification, cooking techniques- meal prep concept; apps for meal planning Other:  Mediterranean diet pattern for overall health and patient goal of reducing pseudotumor cerebri  Nutritional Diagnosis:  Foster-3.3 Overweight/obesity As related to history of excess calories and inadequate physical activity.  As evidenced by patient with current BMI of 29.6, working on dietary changes to promote weight loss.  Intervention:   Instruction and discussion as noted above.  Patient has begun making positive diet changes and seems to have begun losing weight.  Established goals for additional change to ensure adequate and balanced nutrition.   Education Materials given:  Marland Kitchen Mediterranean Diet handout . Plate Planner with food lists, sample meal pattern . Sample menus . Snacking handout . Recipe Modification handouts ([place], [place]) . Goals/ instructions   Learner/ who was taught:  . Patient   Level of understanding: Marland Kitchen Verbalizes/ demonstrates competency   Demonstrated degree of understanding via:   Teach back Learning barriers: . None  Willingness to learn/ readiness for change: . Eager, change in progress   Monitoring and Evaluation:  Dietary intake, exercise, and body weight      follow up: 10/16/19 at 11:30am

## 2019-10-16 ENCOUNTER — Ambulatory Visit: Payer: BLUE CROSS/BLUE SHIELD | Admitting: Dietician

## 2019-10-17 ENCOUNTER — Telehealth: Payer: Self-pay | Admitting: Dietician

## 2019-10-17 NOTE — Telephone Encounter (Signed)
Called patient to reschedule her cancelled appointment from 10/16/19 (cancelled due to inclement weather). Unable to leave a message, voicemail has not been set up.

## 2019-10-22 ENCOUNTER — Other Ambulatory Visit: Payer: Self-pay | Admitting: Interventional Radiology

## 2019-10-22 ENCOUNTER — Other Ambulatory Visit: Payer: Self-pay

## 2019-10-22 ENCOUNTER — Ambulatory Visit
Admission: RE | Admit: 2019-10-22 | Discharge: 2019-10-22 | Disposition: A | Discharge status: Home / Self Care | Payer: BLUE CROSS/BLUE SHIELD | Source: Ambulatory Visit | Attending: Obstetrics and Gynecology | Admitting: Obstetrics and Gynecology

## 2019-10-22 ENCOUNTER — Encounter: Payer: Self-pay | Admitting: *Deleted

## 2019-10-22 DIAGNOSIS — D250 Submucous leiomyoma of uterus: Secondary | ICD-10-CM

## 2019-10-22 HISTORY — PX: IR RADIOLOGIST EVAL & MGMT: IMG5224

## 2019-10-22 NOTE — Consult Note (Signed)
Chief Complaint: Patient was consulted remotely today (TeleHealth) for symptomatic uterine fibroids at the request of Banga,Cecilia Worema.    Referring Physician(s): Banga,Cecilia Worema  History of Present Illness: Tammy Khan is a 34 y.o. female who was diagnosed with uterine fibroids approximately 2 years ago.  She is having worsening heavy menstrual bleeding.  Her cycles are every month lasting approximately 7 days with from 4 to 7  heavy days requiring changing ultra tampons every 4 hours as well as passage of clots.  She also describes some interperiod  Bleeding.  She has required previous iron infusions due to anemia.  She tried birth control pills with fibroid control but had continued breakthrough bleeding.  She had no previous fibroid surgery.  No history of gynecologic infections.  She is G1 P1 with no  current plans for future pregnancy.  She is not using any  birth control. Pap smear 09/19/2016 was negative.  Endometrial biopsy 08/13/2019 -.  Ultrasound 10/20/2018 demonstrated multiple fibroids. Past medical history significant for diagnosis of pseudotumor as well as cavernoma.  She is not on any anticoagulation.  She has allergy to topical iodine which causes vomiting, but she has tolerated iodinated contrast without complication.  Also allergy to raspberries.  Past Medical History:  Diagnosis Date  . Anemia   . Family history of breast cancer   . Fibroids   . Menorrhagia   . Pseudotumor (inflammatory) of orbit   . Tension headache     Past Surgical History:  Procedure Laterality Date  . CESAREAN SECTION  2009  . HYSTEROSCOPY WITH D & C N/A 06/19/2016   Procedure: DILATATION AND CURETTAGE /HYSTEROSCOPY;  Surgeon: Honor Loh Ward, MD;  Location: [facility] ORS;  Service: Gynecology;  Laterality: N/A;    Allergies: Iodine and Raspberry  Medications: Prior to Admission medications   Medication Sig Start Date End Date Taking? Authorizing Provider  acetaZOLAMIDE (DIAMOX)  250 MG tablet Take 250 mg twice a day for two weeks then increase to 250 mg three times a day and continue that dose 08/27/19   [provider]  cholecalciferol (VITAMIN D3) 25 MCG (1000 UNIT) tablet Take 1,000 Units by mouth daily.    [provider]  ORIAHNN 300-1-0.5 & 300 MG CPPK Take by mouth in the morning and at bedtime.  08/08/19   [provider]  venlafaxine (EFFEXOR) 37.5 MG tablet Take by mouth.    [provider]  vitamin C (ASCORBIC ACID) 250 MG tablet Take 250 mg by mouth daily.    [provider]  zinc sulfate (ZINC-220) 220 (50 Zn) MG capsule Take 220 mg by mouth daily.    [provider]     Family History  Problem Relation Age of Onset  . Breast cancer Paternal Aunt 66  . Mental illness Mother   . Hypertension Mother   . Hypertension Father   . Hypertension Maternal Aunt   . Diabetes Maternal Aunt   . Diabetes Maternal Uncle   . Hypertension Maternal Uncle     Social History   Socioeconomic History  . Marital status: Married    Spouse name: Not on file  . Number of children: 1  . Years of education: Not on file  . Highest education level: Not on file  Occupational History  . Not on file  Tobacco Use  . Smoking status: Never Smoker  . Smokeless tobacco: Never Used  Vaping Use  . Vaping Use: Never used  Substance and Sexual Activity  .  Alcohol use: No  . Drug use: No  . Sexual activity: Not on file  Other Topics Concern  . Not on file  Social History Narrative  . Not on file   Social Determinants of Health   Financial Resource Strain:   . Difficulty of Paying Living Expenses:   Food Insecurity:   . Worried About Charity fundraiser in the Last Year:   . Arboriculturist in the Last Year:   Transportation Needs:   . Film/video editor (Medical):   Marland Kitchen Lack of Transportation (Non-Medical):   Physical Activity:   . Days of Exercise per Week:   . Minutes of Exercise per Session:   Stress:   .  Feeling of Stress :   Social Connections:   . Frequency of Communication with Friends and Family:   . Frequency of Social Gatherings with Friends and Family:   . Attends Religious Services:   . Active Member of Clubs or Organizations:   . Attends Archivist Meetings:   Marland Kitchen Marital Status:     ECOG Status: 1 - Symptomatic but completely ambulatory  Review of Systems  Review of Systems: A 12 point ROS discussed and pertinent positives are indicated in the HPI above.  All other systems are negative.  Physical Exam No direct physical exam was performed (except for noted visual exam findings with Video Visits).     Vital Signs: There were no vitals taken for this visit.  Imaging: No results found.  Labs:  CBC: Recent Labs    08/10/19 1802  WBC 8.1  HGB 12.8  HCT 39.1  PLT 327    COAGS: Recent Labs    08/10/19 1802  INR 0.9  APTT 27    BMP: Recent Labs    08/10/19 1802  NA 134*  K 3.1*  CL 101  CO2 25  GLUCOSE 147*  BUN 13  CALCIUM 8.8*  CREATININE 0.81  GFRNONAA >60  GFRAA >60    LIVER FUNCTION TESTS: Recent Labs    08/10/19 1802  BILITOT 0.5  AST 15  ALT 9  ALKPHOS 69  PROT 7.6  ALBUMIN 3.8    TUMOR MARKERS: No results for input(s): AFPTM, CEA, CA199, CHROMGRNA in the last 8760 hours.  Assessment and Plan:  My impression is that this patient's menorrhagia is  likely secondary to uterine fibroids. We spent the majority of the consultation discussing the pathophysiology of uterine leiomyomata, natural history, anticipated  involution post menopause, and treatment options. We discussed myomectomy, hysterectomy, and uterine fibroid embolization. I described the technique of UFE, anticipated benefits, possible risks and complications including but not limited to bleeding, infection, vessel damage, nontarget embolization, and incomplete symptom relief. We discussed the 90% clinical success rate historically and at our experience with UFE.  We discussed the post procedure course and time course of symptom resolution. We discussed the need for continued gynecologic care.  She seemed to understand and did ask appropriate questions, which were answered.  Based on her evaluation thus far, I think she would be an appropriate candidate for uterine fibroid embolization because of her symptomatology and  uterine fibroids. To complete her evaluation and workup, I would require pelvic MRI with contrast to best determine the exact site of location of her uterine fibroids, specifically to exclude any pedunculated fibroids on a narrow stalk, as well as to exclude any unexpected pelvic pathology.   After our discussion, the patient was motivated proceed. Accordingly, we can set up  the MRI   at her convenience as an outpatient. If this looks okay, she would like to proceed with UFE.   Thank you for this interesting consult.  I greatly enjoyed meeting Tammy Khan and look forward to participating in their care.  A copy of this report was sent to the requesting provider on this date.  Electronically Signed: Rickard Rhymes 10/22/2019, 2:20 PM   I spent a total of  40 Minutes   in remote  clinical consultation, greater than 50% of which was counseling/coordinating care for symptomatic uterine fibroids.    Visit type: Audio only (telephone). Audio (no video) only due to patient's lack of internet/smartphone capability. Alternative for in-person consultation at [facility], [address] [place], [place], [place]. This visit type was conducted due to national recommendations for restrictions regarding the COVID-19 Pandemic (e.g. social distancing).  This format is felt to be most appropriate for this patient at this time.  All issues noted in this document were discussed and addressed.

## 2019-11-07 ENCOUNTER — Encounter: Payer: Self-pay | Admitting: Dietician

## 2019-11-07 NOTE — Progress Notes (Signed)
Have not heard back from patient to reschedule her cancelled appointment from 10/16/19. Sent notification to referring provider.

## 2019-11-25 ENCOUNTER — Ambulatory Visit: Payer: BLUE CROSS/BLUE SHIELD

## 2019-12-09 ENCOUNTER — Ambulatory Visit
Admission: RE | Admit: 2019-12-09 | Discharge: 2019-12-09 | Disposition: A | Discharge status: Home / Self Care | Payer: BLUE CROSS/BLUE SHIELD | Source: Ambulatory Visit | Attending: Interventional Radiology | Admitting: Interventional Radiology

## 2019-12-09 ENCOUNTER — Other Ambulatory Visit: Payer: Self-pay

## 2019-12-09 DIAGNOSIS — D250 Submucous leiomyoma of uterus: Secondary | ICD-10-CM

## 2019-12-09 MED ORDER — GADOBUTROL 1 MMOL/ML IV SOLN
7.0000 mL | Freq: Once | INTRAVENOUS | Status: AC | PRN
  Administered 2019-12-09: 7 mL via INTRAVENOUS

## 2020-01-07 ENCOUNTER — Other Ambulatory Visit (HOSPITAL_COMMUNITY): Payer: Self-pay | Admitting: Interventional Radiology

## 2020-01-07 DIAGNOSIS — D259 Leiomyoma of uterus, unspecified: Secondary | ICD-10-CM

## 2020-01-12 ENCOUNTER — Other Ambulatory Visit (HOSPITAL_COMMUNITY)
Admission: RE | Admit: 2020-01-12 | Discharge: 2020-01-12 | Disposition: A | Discharge status: Home / Self Care | Payer: BLUE CROSS/BLUE SHIELD | Source: Ambulatory Visit | Attending: Interventional Radiology | Admitting: Interventional Radiology

## 2020-01-12 DIAGNOSIS — Z01812 Encounter for preprocedural laboratory examination: Secondary | ICD-10-CM | POA: Diagnosis present

## 2020-01-12 DIAGNOSIS — Z20822 Contact with and (suspected) exposure to covid-19: Secondary | ICD-10-CM | POA: Diagnosis not present

## 2020-01-12 LAB — SARS CORONAVIRUS 2 (TAT 6-24 HRS): SARS Coronavirus 2: NEGATIVE

## 2020-01-13 ENCOUNTER — Other Ambulatory Visit: Payer: Self-pay | Admitting: Radiology

## 2020-01-13 ENCOUNTER — Other Ambulatory Visit: Payer: Self-pay | Admitting: Student

## 2020-01-13 NOTE — H&P (Signed)
Chief Complaint: Patient was seen in consultation today for uterine fibroid embolization.   Referring Physician(s): Dr. Terri Piedra  Supervising Physician: Arne Cleveland  Patient Status: [facility] - In-pt  History of Present Illness: Tammy Khan is a 34 y.o. female with a medical history significant for anemia, pseudotumor cerebri, fibroids and menorrhagia. 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   Tammy Khan was diagnosed with uterine fibroids approximately two years ago. Her menstrual cycles have become progressively heavy and painful and she has required iron transfusions due to anemia. She met with Dr. Vernard Gambles 10/22/19 to discuss possible treatment options including uterine fibroid embolization. The patient was in agreement to proceed with this treatment option and she had an MRI pelvis done 12/10/19 for further work up.   MRI 12/10/19: Reproductive: Uterus: Measures 8.4 by 8.4 by 7.3 cm (volume = 270 cm^3).  An intramural fibroid with a submucosal component is seen anterior uterine corpus, which measures 3.8 cm in maximum diameter. This fibroid shows diffuse contrast enhancement. A 2nd intramural fibroid is seen in the posterior corpus which measures 2.9 cm in maximum diameter. This fibroid shows lack of contrast enhancement, consistent with diffuse internal degeneration.  -- Intracavitary fibroids:  None.  -- Pedunculated fibroids: None.  -- Right ovary:  Appears normal.  No mass identified.  -- Left ovary:  Appears normal.  No mass identified.  Other: Tiny amount of free fluid in pelvic cul-de-sac, which is likely physiologic.   Past Medical History:  Diagnosis Date  . Anemia   . Family history of breast cancer   . Fibroids   . Menorrhagia   . Pseudotumor (inflammatory) of orbit   . Tension headache     Past Surgical History:  Procedure Laterality Date  . CESAREAN SECTION  2009  . HYSTEROSCOPY WITH D & C N/A  06/19/2016   Procedure: DILATATION AND CURETTAGE /HYSTEROSCOPY;  Surgeon: Honor Loh Ward, MD;  Location: [facility] ORS;  Service: Gynecology;  Laterality: N/A;  . IR RADIOLOGIST EVAL & MGMT  10/22/2019    Allergies: Iodine and Raspberry  Medications: Prior to Admission medications   Medication Sig Start Date End Date Taking? Authorizing Provider  acetaZOLAMIDE (DIAMOX) 250 MG tablet Take 250 mg twice a day for two weeks then increase to 250 mg three times a day and continue that dose 08/27/19   [provider]  cholecalciferol (VITAMIN D3) 25 MCG (1000 UNIT) tablet Take 1,000 Units by mouth daily.    [provider]  ORIAHNN 300-1-0.5 & 300 MG CPPK Take by mouth in the morning and at bedtime.  08/08/19   [provider]  venlafaxine (EFFEXOR) 37.5 MG tablet Take by mouth.    [provider]  vitamin C (ASCORBIC ACID) 250 MG tablet Take 250 mg by mouth daily.    [provider]  zinc sulfate (ZINC-220) 220 (50 Zn) MG capsule Take 220 mg by mouth daily.    [provider]     Family History  Problem Relation Age of Onset  . Breast cancer Paternal Aunt 76  . Mental illness Mother   . Hypertension Mother   . Hypertension Father   . Hypertension Maternal Aunt   . Diabetes Maternal Aunt   . Diabetes Maternal Uncle   . Hypertension Maternal Uncle     Social History   Socioeconomic History  . Marital status: Married    Spouse name: Not on file  . Number of children: 1  .  Years of education: Not on file  . Highest education level: Not on file  Occupational History  . Not on file  Tobacco Use  . Smoking status: Never Smoker  . Smokeless tobacco: Never Used  Vaping Use  . Vaping Use: Never used  Substance and Sexual Activity  . Alcohol use: No  . Drug use: No  . Sexual activity: Not on file  Other Topics Concern  . Not on file  Social History Narrative  . Not on file   Social Determinants of Health   Financial Resource Strain:    . Difficulty of Paying Living Expenses: Not on file  Food Insecurity:   . Worried About Charity fundraiser in the Last Year: Not on file  . Ran Out of Food in the Last Year: Not on file  Transportation Needs:   . Lack of Transportation (Medical): Not on file  . Lack of Transportation (Non-Medical): Not on file  Physical Activity:   . Days of Exercise per Week: Not on file  . Minutes of Exercise per Session: Not on file  Stress:   . Feeling of Stress : Not on file  Social Connections:   . Frequency of Communication with Friends and Family: Not on file  . Frequency of Social Gatherings with Friends and Family: Not on file  . Attends Religious Services: Not on file  . Active Member of Clubs or Organizations: Not on file  . Attends Archivist Meetings: Not on file  . Marital Status: Not on file    Review of Systems: A 12 point ROS discussed and pertinent positives are indicated in the HPI above.  All other systems are negative.  Review of Systems  Constitutional: Negative for appetite change and fatigue.  Respiratory: Negative for cough and shortness of breath.   Cardiovascular: Negative for chest pain and leg swelling.  Gastrointestinal: Negative for abdominal pain, diarrhea, nausea and vomiting.  Musculoskeletal: Negative for back pain.  Neurological: Negative for headaches.    Vital Signs: BP (!) 145/104 (BP Location: Right Arm)   Pulse 79   Temp 98.2 F (36.8 C) (Oral)   Resp 16   LMP 01/14/2019 Comment: on Oriahnn  SpO2 99%   Physical Exam Constitutional:      General: She is not in acute distress.    Appearance: Normal appearance.  HENT:     Mouth/Throat:     Mouth: Mucous membranes are moist.     Pharynx: Oropharynx is clear.  Cardiovascular:     Rate and Rhythm: Normal rate and regular rhythm.     Pulses: Normal pulses.     Heart sounds: Normal heart sounds.  Pulmonary:     Effort: Pulmonary effort is normal.     Breath sounds: Normal breath  sounds.  Abdominal:     General: Bowel sounds are normal.     Palpations: Abdomen is soft.  Genitourinary:    Comments: Patient takes a drug called Barbette Merino that prevents menstrual bleeding. Her last menstrual cycle was October 2020.  Musculoskeletal:        General: Normal range of motion.  Skin:    General: Skin is warm and dry.  Neurological:     Mental Status: She is alert and oriented to person, place, and time.     Imaging: No results found.  Labs:  CBC: Recent Labs    08/10/19 1802  WBC 8.1  HGB 12.8  HCT 39.1  PLT 327    COAGS: Recent Labs  08/10/19 1802  INR 0.9  APTT 27    BMP: Recent Labs    08/10/19 1802  NA 134*  K 3.1*  CL 101  CO2 25  GLUCOSE 147*  BUN 13  CALCIUM 8.8*  CREATININE 0.81  GFRNONAA >60  GFRAA >60    LIVER FUNCTION TESTS: Recent Labs    08/10/19 1802  BILITOT 0.5  AST 15  ALT 9  ALKPHOS 69  PROT 7.6  ALBUMIN 3.8    TUMOR MARKERS: No results for input(s): AFPTM, CEA, CA199, CHROMGRNA in the last 8760 hours.  Assessment and Plan:  Menorrhagia secondary to uterine fibroids: Alwyn Pea, 34 year old female, presents today to the [facility] department for an elective uterine fibroid embolization. The patient has one son and she understands this procedure may cause infertility. The patient also understands she will be admitted for overnight observation.    The Risks and benefits of embolization were discussed with the patient including, but not limited to bleeding, infection, vascular injury, post operative pain, or contrast induced renal failure.  This procedure involves the use of X-rays and because of the nature of the planned procedure, it is possible that we will have prolonged use of X-ray fluoroscopy.  Potential radiation risks to you include (but are not limited to) the following: - A slightly elevated risk for cancer several years later in life. This risk is typically  less than 0.5% percent. This risk is low in comparison to the normal incidence of human cancer, which is 33% for women and 50% for men according to the [employer]. - Radiation induced injury can include skin redness, resembling a rash, tissue breakdown / ulcers and hair loss (which can be temporary or permanent).  The likelihood of either of these occurring depends on the difficulty of the procedure and whether you are sensitive to radiation due to previous procedures, disease, or genetic conditions.  IF your procedure requires a prolonged use of radiation, you will be notified and given written instructions for further action. It is your responsibility to monitor the irradiated area for the 2 weeks following the procedure and to notify your physician if you are concerned that you have suffered a radiation induced injury.   All of the patient's questions were answered, patient is agreeable to proceed.  The patient has been NPO. Vitals have been reviewed. Lab work is still pending but will be reviewed prior to the start of the procedure.   Consent signed and in chart.  Thank you for this interesting consult.  I greatly enjoyed meeting Savhanna EARLENA WERST and look forward to participating in their care.  A copy of this report was sent to the requesting provider on this date.  Electronically Signed: Soyla Dryer, AGACNP-BC [phone] 01/14/2020, 8:41 AM   I spent a total of 40 Minutes    in face to face in clinical consultation, greater than 50% of which was counseling/coordinating care for uterine fibroid embolization.

## 2020-01-14 ENCOUNTER — Ambulatory Visit (HOSPITAL_COMMUNITY)
Admission: RE | Admit: 2020-01-14 | Discharge: 2020-01-14 | Disposition: A | Discharge status: Home / Self Care | Payer: BLUE CROSS/BLUE SHIELD | Source: Ambulatory Visit | Attending: Interventional Radiology | Admitting: Interventional Radiology

## 2020-01-14 ENCOUNTER — Ambulatory Visit (HOSPITAL_COMMUNITY)
Admission: RE | Admit: 2020-01-14 | Discharge: 2020-01-15 | Disposition: A | Discharge status: Home / Self Care | Payer: BLUE CROSS/BLUE SHIELD | Source: Ambulatory Visit | Attending: Interventional Radiology | Admitting: Interventional Radiology

## 2020-01-14 ENCOUNTER — Other Ambulatory Visit: Payer: Self-pay

## 2020-01-14 DIAGNOSIS — D250 Submucous leiomyoma of uterus: Secondary | ICD-10-CM | POA: Diagnosis not present

## 2020-01-14 DIAGNOSIS — D219 Benign neoplasm of connective and other soft tissue, unspecified: Secondary | ICD-10-CM

## 2020-01-14 DIAGNOSIS — Z79899 Other long term (current) drug therapy: Secondary | ICD-10-CM | POA: Diagnosis not present

## 2020-01-14 DIAGNOSIS — Z803 Family history of malignant neoplasm of breast: Secondary | ICD-10-CM | POA: Insufficient documentation

## 2020-01-14 DIAGNOSIS — D259 Leiomyoma of uterus, unspecified: Secondary | ICD-10-CM

## 2020-01-14 HISTORY — PX: IR ANGIOGRAM SELECTIVE EACH ADDITIONAL VESSEL: IMG667

## 2020-01-14 HISTORY — PX: IR ANGIOGRAM PELVIS SELECTIVE OR SUPRASELECTIVE: IMG661

## 2020-01-14 HISTORY — PX: IR EMBO TUMOR ORGAN ISCHEMIA INFARCT INC GUIDE ROADMAPPING: IMG5449

## 2020-01-14 HISTORY — PX: IR US GUIDE VASC ACCESS LEFT: IMG2389

## 2020-01-14 LAB — HCG, SERUM, QUALITATIVE: Preg, Serum: NEGATIVE

## 2020-01-14 LAB — BASIC METABOLIC PANEL
Anion gap: 10 (ref 5–15)
BUN: 12 mg/dL (ref 6–20)
CO2: 24 mmol/L (ref 22–32)
Calcium: 8.7 mg/dL — ABNORMAL LOW (ref 8.9–10.3)
Chloride: 102 mmol/L (ref 98–111)
Creatinine, Ser: 0.63 mg/dL (ref 0.44–1.00)
GFR calc non Af Amer: >60 mL/min (ref >60)
Glucose, Bld: 89 mg/dL (ref 70–99)
Potassium: 4.2 mmol/L (ref 3.5–5.1)
Sodium: 136 mmol/L (ref 135–145)

## 2020-01-14 LAB — CBC WITH DIFFERENTIAL/PLATELET
Abs Immature Granulocytes: 0.02 10*3/uL (ref 0.00–0.07)
Basophils Absolute: 0 10*3/uL (ref 0.0–0.1)
Basophils Relative: 0 %
Eosinophils Absolute: 0 10*3/uL (ref 0.0–0.5)
Eosinophils Relative: 1 %
HCT: 41.3 % (ref 36.0–46.0)
Hemoglobin: 13 g/dL (ref 12.0–15.0)
Immature Granulocytes: 0 %
Lymphocytes Relative: 32 %
Lymphs Abs: 2.1 10*3/uL (ref 0.7–4.0)
MCH: 26.4 pg (ref 26.0–34.0)
MCHC: 31.5 g/dL (ref 30.0–36.0)
MCV: 83.9 fL (ref 80.0–100.0)
Monocytes Absolute: 0.4 10*3/uL (ref 0.1–1.0)
Monocytes Relative: 5 %
Neutro Abs: 4 10*3/uL (ref 1.7–7.7)
Neutrophils Relative %: 62 %
Platelets: 318 10*3/uL (ref 150–400)
RBC: 4.92 MIL/uL (ref 3.87–5.11)
RDW: 13.8 % (ref 11.5–15.5)
WBC: 6.5 10*3/uL (ref 4.0–10.5)
nRBC: 0 % (ref 0.0–0.2)

## 2020-01-14 MED ORDER — ELAGOLIX-ESTRAD-NORETH & ELAGO 300-1-0.5 & 300 MG PO CPPK: 1.0000 | ORAL_CAPSULE | Freq: Two times a day (BID) | ORAL | Status: DC

## 2020-01-14 MED ORDER — SODIUM CHLORIDE 0.9% FLUSH: 9.0000 mL | INTRAVENOUS | Status: DC | PRN

## 2020-01-14 MED ORDER — SODIUM CHLORIDE 0.9 % IV SOLN: INTRAVENOUS | Status: DC

## 2020-01-14 MED ORDER — ACETAZOLAMIDE 250 MG PO TABS: 250.0000 mg | ORAL_TABLET | Freq: Three times a day (TID) | ORAL | Status: DC

## 2020-01-14 MED ORDER — SODIUM CHLORIDE 0.9 % IV SOLN: 250.0000 mL | INTRAVENOUS | Status: DC | PRN

## 2020-01-14 MED ORDER — ASCORBIC ACID 500 MG PO TABS
250.0000 mg | ORAL_TABLET | Freq: Every day | ORAL | Status: DC
  Administered 2020-01-14 – 2020-01-15 (×2): 250 mg via ORAL
  Filled 2020-01-14 (×2): qty 1

## 2020-01-14 MED ORDER — HYDROMORPHONE 1 MG/ML IV SOLN
INTRAVENOUS | Status: DC
  Administered 2020-01-14: 30 mg via INTRAVENOUS
  Administered 2020-01-14: 2.4 mg via INTRAVENOUS
  Administered 2020-01-14: 3.3 mg via INTRAVENOUS
  Administered 2020-01-14: 1.2 mg via INTRAVENOUS
  Administered 2020-01-15 (×2): 1.8 mg via INTRAVENOUS

## 2020-01-14 MED ORDER — IBUPROFEN 800 MG PO TABS
800.0000 mg | ORAL_TABLET | Freq: Four times a day (QID) | ORAL | Status: DC
  Administered 2020-01-14 – 2020-01-15 (×4): 800 mg via ORAL
  Filled 2020-01-14 (×4): qty 1

## 2020-01-14 MED ORDER — FENTANYL CITRATE (PF) 100 MCG/2ML IJ SOLN
INTRAMUSCULAR | Status: AC
  Filled 2020-01-14: qty 4

## 2020-01-14 MED ORDER — PROMETHAZINE HCL 25 MG RE SUPP: 25.0000 mg | Freq: Three times a day (TID) | RECTAL | Status: DC | PRN

## 2020-01-14 MED ORDER — ONDANSETRON HCL 4 MG/2ML IJ SOLN
4.0000 mg | Freq: Four times a day (QID) | INTRAMUSCULAR | Status: DC | PRN
  Administered 2020-01-14: 4 mg via INTRAVENOUS
  Filled 2020-01-14: qty 2

## 2020-01-14 MED ORDER — ZINC SULFATE 220 (50 ZN) MG PO CAPS
220.0000 mg | ORAL_CAPSULE | Freq: Every day | ORAL | Status: DC
  Administered 2020-01-14 – 2020-01-15 (×2): 220 mg via ORAL
  Filled 2020-01-14 (×2): qty 1

## 2020-01-14 MED ORDER — KETOROLAC TROMETHAMINE 30 MG/ML IJ SOLN
INTRAMUSCULAR | Status: AC
  Administered 2020-01-14: 30 mg via INTRAVENOUS
  Filled 2020-01-14: qty 1

## 2020-01-14 MED ORDER — NALOXONE HCL 0.4 MG/ML IJ SOLN: 0.4000 mg | INTRAMUSCULAR | Status: DC | PRN

## 2020-01-14 MED ORDER — KETOROLAC TROMETHAMINE 30 MG/ML IJ SOLN: 30.0000 mg | Freq: Once | INTRAMUSCULAR | Status: AC

## 2020-01-14 MED ORDER — LIDOCAINE HCL (PF) 1 % IJ SOLN
INTRAMUSCULAR | Status: AC | PRN
  Administered 2020-01-14: 2 mL via INTRADERMAL

## 2020-01-14 MED ORDER — IOHEXOL 300 MG/ML  SOLN
100.0000 mL | Freq: Once | INTRAMUSCULAR | Status: AC | PRN
  Administered 2020-01-14: 20 mL via INTRA_ARTERIAL

## 2020-01-14 MED ORDER — IOHEXOL 300 MG/ML  SOLN: 100.0000 mL | Freq: Once | INTRAMUSCULAR | Status: DC | PRN

## 2020-01-14 MED ORDER — LIDOCAINE HCL 1 % IJ SOLN
INTRAMUSCULAR | Status: AC
  Filled 2020-01-14: qty 20

## 2020-01-14 MED ORDER — MIDAZOLAM HCL 2 MG/2ML IJ SOLN
INTRAMUSCULAR | Status: AC | PRN
  Administered 2020-01-14: 1 mg via INTRAVENOUS
  Administered 2020-01-14 (×2): 0.5 mg via INTRAVENOUS
  Administered 2020-01-14: 1 mg via INTRAVENOUS

## 2020-01-14 MED ORDER — MIDAZOLAM HCL 2 MG/2ML IJ SOLN
INTRAMUSCULAR | Status: AC
  Filled 2020-01-14: qty 6

## 2020-01-14 MED ORDER — SODIUM CHLORIDE 0.9% FLUSH: 3.0000 mL | INTRAVENOUS | Status: DC | PRN

## 2020-01-14 MED ORDER — DOCUSATE SODIUM 100 MG PO CAPS
100.0000 mg | ORAL_CAPSULE | Freq: Two times a day (BID) | ORAL | Status: DC
  Administered 2020-01-14 – 2020-01-15 (×3): 100 mg via ORAL
  Filled 2020-01-14 (×3): qty 1

## 2020-01-14 MED ORDER — FENTANYL CITRATE (PF) 100 MCG/2ML IJ SOLN
INTRAMUSCULAR | Status: AC | PRN
  Administered 2020-01-14 (×3): 50 ug via INTRAVENOUS

## 2020-01-14 MED ORDER — HEPARIN SODIUM (PORCINE) 1000 UNIT/ML IJ SOLN
INTRAMUSCULAR | Status: AC
  Filled 2020-01-14: qty 1

## 2020-01-14 MED ORDER — METHOCARBAMOL 500 MG PO TABS
500.0000 mg | ORAL_TABLET | Freq: Four times a day (QID) | ORAL | Status: DC
  Administered 2020-01-14 – 2020-01-15 (×4): 500 mg via ORAL
  Filled 2020-01-14 (×4): qty 1

## 2020-01-14 MED ORDER — VERAPAMIL HCL 2.5 MG/ML IV SOLN
INTRAVENOUS | Status: AC
  Filled 2020-01-14: qty 2

## 2020-01-14 MED ORDER — VITAMIN D 25 MCG (1000 UNIT) PO TABS
1000.0000 [IU] | ORAL_TABLET | Freq: Every day | ORAL | Status: DC
  Administered 2020-01-14 – 2020-01-15 (×2): 1000 [IU] via ORAL
  Filled 2020-01-14 (×2): qty 1

## 2020-01-14 MED ORDER — SODIUM CHLORIDE 0.9% FLUSH
3.0000 mL | Freq: Two times a day (BID) | INTRAVENOUS | Status: DC
  Administered 2020-01-15: 3 mL via INTRAVENOUS

## 2020-01-14 MED ORDER — HYDROCODONE-ACETAMINOPHEN 5-325 MG PO TABS: 1.0000 | ORAL_TABLET | ORAL | Status: DC | PRN

## 2020-01-14 MED ORDER — DIPHENHYDRAMINE HCL 12.5 MG/5ML PO ELIX
12.5000 mg | ORAL_SOLUTION | Freq: Four times a day (QID) | ORAL | Status: DC | PRN
  Filled 2020-01-14: qty 5

## 2020-01-14 MED ORDER — PROMETHAZINE HCL 25 MG PO TABS: 25.0000 mg | ORAL_TABLET | Freq: Three times a day (TID) | ORAL | Status: DC | PRN

## 2020-01-14 MED ORDER — IBUPROFEN 200 MG PO TABS
ORAL_TABLET | ORAL | Status: AC
  Filled 2020-01-14: qty 4

## 2020-01-14 MED ORDER — CEFAZOLIN SODIUM-DEXTROSE 2-4 GM/100ML-% IV SOLN: 2.0000 g | Freq: Once | INTRAVENOUS | Status: AC

## 2020-01-14 MED ORDER — IOHEXOL 300 MG/ML  SOLN
100.0000 mL | Freq: Once | INTRAMUSCULAR | Status: AC | PRN
  Administered 2020-01-14: 50 mL via INTRA_ARTERIAL

## 2020-01-14 MED ORDER — CEFAZOLIN SODIUM-DEXTROSE 2-4 GM/100ML-% IV SOLN
INTRAVENOUS | Status: AC
  Administered 2020-01-14: 2 g via INTRAVENOUS
  Filled 2020-01-14: qty 100

## 2020-01-14 MED ORDER — NITROGLYCERIN IN D5W 100-5 MCG/ML-% IV SOLN
INTRAVENOUS | Status: AC
  Filled 2020-01-14: qty 250

## 2020-01-14 MED ORDER — DIPHENHYDRAMINE HCL 50 MG/ML IJ SOLN
12.5000 mg | Freq: Four times a day (QID) | INTRAMUSCULAR | Status: DC | PRN
  Administered 2020-01-15: 12.5 mg via INTRAVENOUS
  Filled 2020-01-14: qty 1

## 2020-01-14 NOTE — Procedures (Signed)
  Procedure: Bilat [place] via L radial   EBL:   minimal Complications:  none immediate  See full dictation in [employer].  Dillard Cannon MD Main # [phone] Pager  [phone] Mobile [phone]

## 2020-01-14 NOTE — Progress Notes (Signed)
Report called to [facility] for room 1609

## 2020-01-14 NOTE — Sedation Documentation (Signed)
Patient is resting comfortably, snoring lightly, with eyes closed, in NAD. 

## 2020-01-14 NOTE — Progress Notes (Signed)
MEDICATION-RELATED CONSULT NOTE   IR Procedure Consult - Anticoagulant/Antiplatelet PTA/Inpatient Med List Review by Pharmacist    Procedure: IR uterine fibroid emobolization     Completed: 01/14/20 at 11:08  Post-Procedural bleeding risk per IR MD assessment:  low  Antithrombotic medications on inpatient or PTA profile prior to procedure:   none    Recommended restart time per IR Post-Procedure Guidelines:  n/a   Other considerations:      Plan:    SCDs ordered post-op   Peggyann Juba, PharmD, BCPS 01/14/2020 11:25 AM Pharmacy: [phone]

## 2020-01-14 NOTE — Progress Notes (Signed)
Patient seen at the bedside post-procedure/recovery. She is awake and alert and denies any significant pain or discomfort. Her foley catheter has been removed and she is eat and drinking. She denies nausea. Left radial site is soft and non-tender. Scant amount of blood under the tegaderm.   IR will round on the patient tomorrow with plans to discharge home.  Please call IR with any questions.  Soyla Dryer, [place] [phone] 01/14/2020, 3:46 PM

## 2020-01-14 NOTE — Progress Notes (Signed)
Patient taken to 1609 via stretcher.

## 2020-01-15 ENCOUNTER — Encounter (HOSPITAL_COMMUNITY): Payer: Self-pay

## 2020-01-15 DIAGNOSIS — D250 Submucous leiomyoma of uterus: Secondary | ICD-10-CM | POA: Diagnosis not present

## 2020-01-15 MED ORDER — DOCUSATE SODIUM 100 MG PO CAPS: 100.0000 mg | ORAL_CAPSULE | Freq: Two times a day (BID) | ORAL | 0 refills | Status: AC

## 2020-01-15 MED ORDER — IBUPROFEN 600 MG PO TABS: 600.0000 mg | ORAL_TABLET | Freq: Four times a day (QID) | ORAL | 0 refills | Status: AC

## 2020-01-15 MED ORDER — PROMETHAZINE HCL 25 MG PO TABS: 25.0000 mg | ORAL_TABLET | Freq: Three times a day (TID) | ORAL | 0 refills | Status: AC | PRN

## 2020-01-15 MED ORDER — HYDROCODONE-ACETAMINOPHEN 5-325 MG PO TABS
1.0000 | ORAL_TABLET | Freq: Four times a day (QID) | ORAL | 0 refills | Status: AC | PRN
Start: 2020-01-15 — End: ?

## 2020-01-15 NOTE — Discharge Summary (Signed)
Patient ID: JARETZY LHOMMEDIEU MRN: [id number] DOB/AGE: 1986/03/04 34 y.o.  Admit date: 01/14/2020 Discharge date: 01/15/2020  Supervising Physician: Arne Cleveland  Patient Status: [facility] - In-pt  Admission Diagnoses: Symptomatic uterine fibroids  Discharge Diagnoses: Symptomatic uterine fibroids status post bilateral uterine artery embolization on 01/14/2020 Active Problems:   Fibroid   Fibroids  Past Medical History:  Diagnosis Date  . Anemia   . Family history of breast cancer   . Fibroids   . Menorrhagia   . Pseudotumor (inflammatory) of orbit   . Tension headache    Past Surgical History:  Procedure Laterality Date  . CESAREAN SECTION  2009  . HYSTEROSCOPY WITH D & C N/A 06/19/2016   Procedure: DILATATION AND CURETTAGE /HYSTEROSCOPY;  Surgeon: Honor Loh Ward, MD;  Location: [facility] ORS;  Service: Gynecology;  Laterality: N/A;  . IR ANGIOGRAM PELVIS SELECTIVE OR SUPRASELECTIVE  01/14/2020  . IR ANGIOGRAM SELECTIVE EACH ADDITIONAL VESSEL  01/14/2020  . IR ANGIOGRAM SELECTIVE EACH ADDITIONAL VESSEL  01/14/2020  . IR EMBO TUMOR ORGAN ISCHEMIA INFARCT INC GUIDE ROADMAPPING  01/14/2020  . IR RADIOLOGIST EVAL & MGMT  10/22/2019  . IR US GUIDE VASC ACCESS LEFT  01/14/2020     Discharged Condition: good  Hospital Course: Mrs. Mortell is a 34 year old female with history of symptomatic uterine fibroids who underwent consultation with Dr. Vernard Gambles on 10/22/2019 to discuss treatment options.  She was deemed an appropriate candidate for bilateral uterine artery embolization and underwent the procedure at [facility] on 01/14/2020.  The procedure was performed via left radial artery access, with IV conscious sedation and without immediate complications.  She was subsequently admitted to the hospital overnight for pain control. She was placed on Dilaudid PCA pump and given antiemetics as needed.  She did have some occasional pelvic cramping and intermittent nausea overnight.  On the day of  discharge she was stable.  She was able to void, ambulate and tolerate her diet without significant difficulty.  Pelvic cramping was minimal.  She was deemed stable for discharge.  She will continue her current home medications.  Prescriptions for Norco, Colace, ibuprofen and Phenergan were sent electronically to patient's pharmacy.  IR service will follow up with her either via phone call or virtually in 3 to 4 weeks.  Pelvic MRI will be obtained in 6 months.  She will continue current gynecologic care with Dr. Terri Piedra.  She was told to contact our service with any additional questions or concerns.  Consults: none  Significant Diagnostic Studies:  Results for orders placed or performed during the hospital encounter of 10/01/74  Basic metabolic panel  Result Value Ref Range   Sodium 136 135 - 145 mmol/L   Potassium 4.2 3.5 - 5.1 mmol/L   Chloride 102 98 - 111 mmol/L   CO2 24 22 - 32 mmol/L   Glucose, Bld 89 70 - 99 mg/dL   BUN 12 6 - 20 mg/dL   Creatinine, Ser 0.63 0.44 - 1.00 mg/dL   Calcium 8.7 (L) 8.9 - 10.3 mg/dL   GFR calc non Af Amer >60 >60 mL/min   Anion gap 10 5 - 15  CBC with Differential/Platelet  Result Value Ref Range   WBC 6.5 4.0 - 10.5 K/uL   RBC 4.92 3.87 - 5.11 MIL/uL   Hemoglobin 13.0 12.0 - 15.0 g/dL   HCT 41.3 36 - 46 %   MCV 83.9 80.0 - 100.0 fL   MCH 26.4 26.0 - 34.0 pg   MCHC  31.5 30.0 - 36.0 g/dL   RDW 13.8 11.5 - 15.5 %   Platelets 318 150 - 400 K/uL   nRBC 0.0 0.0 - 0.2 %   Neutrophils Relative % 62 %   Neutro Abs 4.0 1.7 - 7.7 K/uL   Lymphocytes Relative 32 %   Lymphs Abs 2.1 0.7 - 4.0 K/uL   Monocytes Relative 5 %   Monocytes Absolute 0.4 0.1 - 1.0 K/uL   Eosinophils Relative 1 %   Eosinophils Absolute 0.0 0 - 0 K/uL   Basophils Relative 0 %   Basophils Absolute 0.0 0 - 0 K/uL   Immature Granulocytes 0 %   Abs Immature Granulocytes 0.02 0.00 - 0.07 K/uL  hCG, serum, qualitative  Result Value Ref Range   Preg, Serum NEGATIVE NEGATIVE      Treatments: Bilateral uterine artery embolization via left radial artery access, with IV conscious sedation on 01/14/2020  Discharge Exam: Blood pressure (!) 142/90, pulse 74, temperature 98.3 F (36.8 C), temperature source Oral, resp. rate 17, height 5' 4.5" (1.638 m), weight 169 lb 1.5 oz (76.7 kg), last menstrual period 01/14/2019, SpO2 98 %. Awake, alert.  Chest clear to auscultation bilaterally.  Heart with regular rate and rhythm.  Abdomen soft, positive bowel sounds, nontender.  No lower extremity edema.  Access site left radial artery clean, some old blood under bandage, no hematoma, nontender, no paresthesias, motor function okay.  Disposition: Discharge disposition: 01-Home or Self Care       Discharge Instructions    Call MD for:  difficulty breathing, headache or visual disturbances   Complete by: As directed    Call MD for:  extreme fatigue   Complete by: As directed    Call MD for:  hives   Complete by: As directed    Call MD for:  persistant dizziness or light-headedness   Complete by: As directed    Call MD for:  persistant nausea and vomiting   Complete by: As directed    Call MD for:  redness, tenderness, or signs of infection (pain, swelling, redness, odor or green/yellow discharge around incision site)   Complete by: As directed    Call MD for:  severe uncontrolled pain   Complete by: As directed    Call MD for:  temperature >100.4   Complete by: As directed    Diet - low sodium heart healthy   Complete by: As directed    Discharge instructions   Complete by: As directed    May resume home medications; stay well-hydrated   Driving Restrictions   Complete by: As directed    No driving for the next 24 hours or after taking narcotics   Increase activity slowly   Complete by: As directed    Lifting restrictions   Complete by: As directed    No heavy lifting for the next 3 to 4 days   May shower / Bathe   Complete by: As directed    May walk up  steps   Complete by: As directed    Sexual Activity Restrictions   Complete by: As directed    No sexual intercourse for 1 week     Allergies as of 01/15/2020      Reactions   Iodine Other (See Comments)   "HOTFLASHES"-PT STATES THIS HAPPENS ONLY WHEN IODINE TOUCHES HER SKIN   Raspberry Rash      Medication List    STOP taking these medications   acetaZOLAMIDE 250 MG tablet Commonly known  as: DIAMOX     TAKE these medications   cholecalciferol 25 MCG (1000 UNIT) tablet Commonly known as: VITAMIN D3 Take 1,000 Units by mouth daily.   docusate sodium 100 MG capsule Commonly known as: COLACE Take 1 capsule (100 mg total) by mouth 2 (two) times daily.   HYDROcodone-acetaminophen 5-325 MG tablet Commonly known as: NORCO/VICODIN Take 1-2 tablets by mouth every 6 (six) hours as needed for moderate pain.   ibuprofen 600 MG tablet Commonly known as: ADVIL Take 1 tablet (600 mg total) by mouth 4 (four) times daily.   Oriahnn 300-1-0.5 & 300 MG Cppk Generic drug: Elagolix-Estrad-Noreth & Elago Take 1 tablet by mouth in the morning and at bedtime.   promethazine 25 MG tablet Commonly known as: PHENERGAN Take 1 tablet (25 mg total) by mouth every 8 (eight) hours as needed for nausea.   vitamin C 250 MG tablet Commonly known as: ASCORBIC ACID Take 250 mg by mouth daily.   Zinc-220 220 (50 Zn) MG capsule Generic drug: zinc sulfate Take 220 mg by mouth daily.       Follow-up Information    Arne Cleveland, MD Follow up.   Specialties: Interventional Radiology, Radiology Why: Dr. Vernard Gambles will follow up with you via phone call or virtually in 3 to 4 weeks; call [phone] or [phone] with any questions Contact information: [place] [address] [phone]        Carlynn Purl [place], DO Follow up.   Specialty: Obstetrics and Gynecology Why: Follow-up with Dr. Terri Piedra as scheduled Contact information: [place] [address] [phone]                Electronically Signed: D. Rowe Robert, PA-C 01/15/2020, 11:02 AM   I have spent Less Than 30 Minutes discharging Watson.

## 2020-01-15 NOTE — Discharge Instructions (Signed)
Uterine Artery Embolization for Fibroids, Care After °This sheet gives you information about how to care for yourself after your procedure. Your health care provider may also give you more specific instructions. If you have problems or questions, contact your health care provider. °What can I expect after the procedure? °After your procedure, it is common to have: °· Pelvic cramping. You will be given pain medicine. °· Nausea and vomiting. You may be given medicine to help relieve nausea. °Follow these instructions at home: °Incision care °· Follow instructions from your health care provider about how to take care of your incision. Make sure you: °? Wash your hands with soap and water before you change your bandage (dressing). If soap and water are not available, use hand sanitizer. °? Change your dressing as told by your health care provider. °· Check your incision area every day for signs of infection. Check for: °? More redness, swelling, or pain. °? More fluid or blood. °? Warmth. °? Pus or a bad smell. °Medicines ° °· Take over-the-counter and prescription medicines only as told by your health care provider. °· Do not take aspirin. It can cause bleeding. °· Do not drive for 24 hours if you were given a medicine to help you relax (sedative). °· Do not drive or use heavy machinery while taking prescription pain medicine. °General instructions °· Ask your health care provider when you can resume sexual activity. °· To prevent or treat constipation while you are taking prescription pain medicine, your health care provider may recommend that you: °? Drink enough fluid to keep your urine clear or pale yellow. °? Take over-the-counter or prescription medicines. °? Eat foods that are high in fiber, such as fresh fruits and vegetables, whole grains, and beans. °? Limit foods that are high in fat and processed sugars, such as fried and sweet foods. °Contact a health care provider if: °· You have a fever. °· You have more  redness, swelling, or pain around your incision site. °· You have more fluid or blood coming from your incision site. °· Your incision feels warm to the touch. °· You have pus or a bad smell coming from your incision. °· You have a rash. °· You have uncontrolled nausea or you cannot eat or drink anything without vomiting. °Get help right away if: °· You have trouble breathing. °· You have chest pain. °· You have severe abdominal pain. °· You have leg pain. °· You become dizzy and faint. °Summary °· After your procedure, it is common to have pelvic cramping. You will be given pain medicine. °· Follow instructions from your health care provider about how to take care of your incision. °· Check your incision area every day for signs of infection. °· Take over-the-counter and prescription medicines only as told by your health care provider. °This information is not intended to replace advice given to you by your health care provider. Make sure you discuss any questions you have with your health care provider. °Document Revised: 03/09/2017 Document Reviewed: 06/29/2016 °Elsevier Patient Education © 2020 [employer] Inc[employer] ° °

## 2020-01-15 NOTE — Progress Notes (Signed)
Patients dilaudid was not available to waste in pyxis. 13 mL wasted with Tammy Khan as my witness. Dilaudid was started in IR.

## 2020-02-18 ENCOUNTER — Ambulatory Visit
Admission: RE | Admit: 2020-02-18 | Discharge: 2020-02-18 | Disposition: A | Discharge status: Home / Self Care | Payer: BLUE CROSS/BLUE SHIELD | Source: Ambulatory Visit | Attending: Radiology | Admitting: Radiology

## 2020-02-18 ENCOUNTER — Encounter: Payer: Self-pay | Admitting: *Deleted

## 2020-02-18 ENCOUNTER — Other Ambulatory Visit: Payer: Self-pay

## 2020-02-18 DIAGNOSIS — D259 Leiomyoma of uterus, unspecified: Secondary | ICD-10-CM

## 2020-02-18 HISTORY — PX: IR RADIOLOGIST EVAL & MGMT: IMG5224

## 2020-02-18 NOTE — Progress Notes (Signed)
Patient ID: Tammy Khan, female   DOB: 1985/05/28, 34 y.o.   MRN: [id number]       Chief Complaint: Patient was consulted remotely today ([place]) for scheduled follow-up 5 weeks post uterine fibroid embolization at the request of Allred,Darrell K.    Referring Physician(s): Carlynn Purl    History of Present Illness: Tammy Khan is a 34 y.o. female  who initially presented to our service 2020-01-14 for symptomatic uterine fibroids.  She was having heavy menstrual bleeding and history of anemia requiring iron infusions.  No adequate response to hormonal treatments.  No previous fibroid surgeries.  G1, P1 with no plans for future pregnancy.  Negative Pap smear and endometrial biopsy. She underwent uncomplicatedUterine fibroid embolization 2020-01-14 via left radial approach.  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  No issues at the radial artery entry site.  She had no recommendations for improvement in her hospital service. She felt like her post embolization syndrome was well controlled with her home medications.  Feels like the symptoms have resolved.  Currently up to full activity.  Her cycles have slightly decreased in the amount of heavy bleeding.  Past Medical History:  Diagnosis Date  . Anemia   . Family history of breast cancer   . Fibroids   . Menorrhagia   . Pseudotumor (inflammatory) of orbit   . Tension headache     Past Surgical History:  Procedure Laterality Date  . CESAREAN SECTION  2009  . HYSTEROSCOPY WITH D & C N/A 06/19/2016   Procedure: DILATATION AND CURETTAGE /HYSTEROSCOPY;  Surgeon: Honor Loh Ward, MD;  Location: [facility] ORS;  Service: Gynecology;  Laterality: N/A;  . IR ANGIOGRAM PELVIS SELECTIVE OR SUPRASELECTIVE  01/14/2020  . IR ANGIOGRAM SELECTIVE EACH ADDITIONAL VESSEL  01/14/2020  . IR ANGIOGRAM SELECTIVE EACH ADDITIONAL VESSEL  01/14/2020  . IR EMBO TUMOR  ORGAN ISCHEMIA INFARCT INC GUIDE ROADMAPPING  01/14/2020  . IR RADIOLOGIST EVAL & MGMT  10/22/2019  . IR US GUIDE VASC ACCESS LEFT  01/14/2020    Allergies: Iodine and Raspberry  Medications: Prior to Admission medications   Medication Sig Start Date End Date Taking? Authorizing Provider  cholecalciferol (VITAMIN D3) 25 MCG (1000 UNIT) tablet Take 1,000 Units by mouth daily.    [provider]  docusate sodium (COLACE) 100 MG capsule Take 1 capsule (100 mg total) by mouth 2 (two) times daily. 01/15/20   Allred, Shirlyn Goltz, PA-C  HYDROcodone-acetaminophen (NORCO/VICODIN) 5-325 MG tablet Take 1-2 tablets by mouth every 6 (six) hours as needed for moderate pain. 01/15/20   Allred, Darrell K, PA-C  ibuprofen (ADVIL) 600 MG tablet Take 1 tablet (600 mg total) by mouth 4 (four) times daily. 01/15/20   Allred, Darrell K, PA-C  ORIAHNN 300-1-0.5 & 300 MG CPPK Take 1 tablet by mouth in the morning and at bedtime.  08/08/19   [provider]  promethazine (PHENERGAN) 25 MG tablet Take 1 tablet (25 mg total) by mouth every 8 (eight) hours as needed for nausea. 01/15/20   Allred, Darrell K, PA-C  vitamin C (ASCORBIC ACID) 250 MG tablet Take 250 mg by mouth daily.    [provider]  zinc sulfate (ZINC-220) 220 (50 Zn) MG capsule Take 220 mg by mouth daily.    [provider]     Family History  Problem Relation Age of Onset  . Breast cancer Paternal Aunt 9  .  Mental illness Mother   . Hypertension Mother   . Hypertension Father   . Hypertension Maternal Aunt   . Diabetes Maternal Aunt   . Diabetes Maternal Uncle   . Hypertension Maternal Uncle     Social History   Socioeconomic History  . Marital status: Married    Spouse name: Not on file  . Number of children: 1  . Years of education: Not on file  . Highest education level: Not on file  Occupational History  . Not on file  Tobacco Use  . Smoking status: Never Smoker  . Smokeless tobacco: Never Used   Vaping Use  . Vaping Use: Never used  Substance and Sexual Activity  . Alcohol use: No  . Drug use: No  . Sexual activity: Not on file  Other Topics Concern  . Not on file  Social History Narrative  . Not on file   Social Determinants of Health   Financial Resource Strain:   . Difficulty of Paying Living Expenses: Not on file  Food Insecurity:   . Worried About Charity fundraiser in the Last Year: Not on file  . Ran Out of Food in the Last Year: Not on file  Transportation Needs:   . Lack of Transportation (Medical): Not on file  . Lack of Transportation (Non-Medical): Not on file  Physical Activity:   . Days of Exercise per Week: Not on file  . Minutes of Exercise per Session: Not on file  Stress:   . Feeling of Stress : Not on file  Social Connections:   . Frequency of Communication with Friends and Family: Not on file  . Frequency of Social Gatherings with Friends and Family: Not on file  . Attends Religious Services: Not on file  . Active Member of Clubs or Organizations: Not on file  . Attends Archivist Meetings: Not on file  . Marital Status: Not on file    ECOG Status: 1 - Symptomatic but completely ambulatory  Review of Systems  Review of Systems: A 12 point ROS discussed and pertinent positives are indicated in the HPI above.  All other systems are negative.  Physical Exam No direct physical exam was performed (except for noted visual exam findings with Video Visits).     Vital Signs: There were no vitals taken for this visit.  Imaging: BILATERAL UTERINE ARTERY EMBOLIZATION   TECHNIQUE: The procedure, risks, benefits, and alternatives were explained to the patient. Questions regarding the procedure were encouraged and answered. The patient understands and consents to the procedure. As antibiotic prophylaxis, cefazolin 2 g was ordered pre-procedure and administered intravenously within one hour of incision.   Ultrasound survey of the  left wrist was performed with images stored and sent to PACs. Diameter radial artery measured 2.6 mm.   1% lidocaine was used for local anesthesia.   A micropuncture needle was used access the left radial artery under ultrasound. With excellent arterial blood flow returned, and an .018 micro wire was passed through the needle into the radial artery. The needle was removed, and a 36F Glidesheath Slender was placed over the wire. The inner dilator and wire were removed, and the sheath was flushed.   Radial cocktail was then infused slowly, after diluting the volume with ~15cc of blood.   A benson wire was then used to navigate the angled 36F catheter into the descending aorta.   The catheter was used to selectively catheterize the left internal iliac artery for pelvic arteriography.  A coaxial catheter was advanced with the shapeable 016 wire and used to selectively catheterize the left uterine artery. The microcatheter tip was positioned in the distal horizontal segment. Selective arteriogram confirms appropriate positioning. Distal branches of the left uterine artery were embolized with 500-700 micron Embospheres. Embolization continued until near stasis of flow was achieved. Microcatheter was withdrawn and a followup selective left internal iliac arteriogram was obtained. The guide catheter was partially withdrawn, then advanced to the contralateral side and the right internal iliac artery was selectively catheterized. Again the microcatheter with 016 guidewire was coaxially advanced and used to selectively catheterize the right uterine artery. Confirmatory arteriogram was performed. Right uterine artery branches were embolized with 500-700 micron Embospheres to near stasis of flow. A total of 2 vials of Embospheres were utilized for the case. Microcatheter was withdrawn and a followup arteriogram of the right internal iliac artery was performed. The angiographic catheter removed.  Additional nitroglycerin and verapamil infused slowly through the radial sheath. The sheath was removed and hemostasis achieved with the TR band. Patient tolerated the procedure well.   COMPLICATIONS: COMPLICATIONS None immediate   IMPRESSION: 1. Technically successful bilateral uterine artery embolization using 500-700 micron Embospheres.     Electronically Signed   By: Lucrezia Europe M.D.   On: 01/14/2020 16:02   Labs:  CBC: Recent Labs    08/10/19 1802 01/14/20 0810  WBC 8.1 6.5  HGB 12.8 13.0  HCT 39.1 41.3  PLT 327 318    COAGS: Recent Labs    08/10/19 1802  INR 0.9  APTT 27    BMP: Recent Labs    08/10/19 1802 01/14/20 0810  NA 134* 136  K 3.1* 4.2  CL 101 102  CO2 25 24  GLUCOSE 147* 89  BUN 13 12  CALCIUM 8.8* 8.7*  CREATININE 0.81 0.63  GFRNONAA >60 >60  GFRAA >60  --     LIVER FUNCTION TESTS: Recent Labs    08/10/19 1802  BILITOT 0.5  AST 15  ALT 9  ALKPHOS 69  PROT 7.6  ALBUMIN 3.8    TUMOR MARKERS: No results for input(s): AFPTM, CEA, CA199, CHROMGRNA in the last 8760 hours.  Assessment and Plan:  My impression is that this patient has done very well in the first 5 weeks post uterine fibroid embolization.  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  We discussed the expectation that the fibroids will continue to involute over 3-6 months with continued improvement of symptoms over that timeframe.  I have no activity restrictions for her at this time.  I did again caution her that should she notice passage of any tissue fragments which might suggest fibroid fragmentation instead of involution, she would let need to let me know ASAP as this would put her at risk for possible infection and could be easily treated with D&C, a relatively unusual delayed complication but worth being aware of.  Otherwise, we will will follow up with her at the 70-month mark to make sure she is doing well.  She  knows to call in the interval should she have any questions or problems possibly related to the uterine fibroid embolization procedure. I reminded her to continue her routine schedule gynecologic care as well.    Thank you for this interesting consult.  I greatly enjoyed meeting Tammy Khan and look forward to participating in their care.  A copy of this report was sent to the requesting  provider on this date.  Electronically Signed: Rickard Rhymes 02/18/2020, 10:52 AM   I spent a total of    25 Minutes in remote  clinical consultation, greater than 50% of which was counseling/coordinating care for uterine fibroids, post embolization.    Visit type: Audio only (telephone). Audio (no video) only due to patient's lack of internet/smartphone capability. Alternative for in-person consultation at [facility], [address] [place], [place], [place]. This visit type was conducted due to national recommendations for restrictions regarding the COVID-19 Pandemic (e.g. social distancing).  This format is felt to be most appropriate for this patient at this time.  All issues noted in this document were discussed and addressed.

## 2020-08-24 ENCOUNTER — Other Ambulatory Visit: Payer: Self-pay | Admitting: Interventional Radiology

## 2020-08-24 DIAGNOSIS — D250 Submucous leiomyoma of uterus: Secondary | ICD-10-CM

## 2021-05-31 DIAGNOSIS — J329 Chronic sinusitis, unspecified: Secondary | ICD-10-CM | POA: Diagnosis not present

## 2021-05-31 DIAGNOSIS — Z0000 Encounter for general adult medical examination without abnormal findings: Secondary | ICD-10-CM | POA: Diagnosis not present

## 2021-08-30 DIAGNOSIS — Z1331 Encounter for screening for depression: Secondary | ICD-10-CM | POA: Diagnosis not present

## 2021-08-30 DIAGNOSIS — Z1329 Encounter for screening for other suspected endocrine disorder: Secondary | ICD-10-CM | POA: Diagnosis not present

## 2021-08-30 DIAGNOSIS — D1800 Hemangioma unspecified site: Secondary | ICD-10-CM | POA: Diagnosis not present

## 2021-08-30 DIAGNOSIS — Z13220 Encounter for screening for lipoid disorders: Secondary | ICD-10-CM | POA: Diagnosis not present

## 2021-08-30 DIAGNOSIS — Z8679 Personal history of other diseases of the circulatory system: Secondary | ICD-10-CM | POA: Diagnosis not present

## 2021-08-30 DIAGNOSIS — Z0000 Encounter for general adult medical examination without abnormal findings: Secondary | ICD-10-CM | POA: Diagnosis not present

## 2021-08-30 DIAGNOSIS — Z130 Encounter for screening for diseases of the blood and blood-forming organs and certain disorders involving the immune mechanism: Secondary | ICD-10-CM | POA: Diagnosis not present

## 2021-08-30 DIAGNOSIS — R519 Headache, unspecified: Secondary | ICD-10-CM | POA: Diagnosis not present

## 2021-08-30 DIAGNOSIS — D259 Leiomyoma of uterus, unspecified: Secondary | ICD-10-CM | POA: Diagnosis not present

## 2021-08-30 DIAGNOSIS — Z131 Encounter for screening for diabetes mellitus: Secondary | ICD-10-CM | POA: Diagnosis not present

## 2021-09-06 DIAGNOSIS — D1800 Hemangioma unspecified site: Secondary | ICD-10-CM | POA: Diagnosis not present

## 2021-09-06 DIAGNOSIS — G939 Disorder of brain, unspecified: Secondary | ICD-10-CM | POA: Diagnosis not present

## 2021-09-22 DIAGNOSIS — G932 Benign intracranial hypertension: Secondary | ICD-10-CM | POA: Diagnosis not present

## 2021-10-19 DIAGNOSIS — N898 Other specified noninflammatory disorders of vagina: Secondary | ICD-10-CM | POA: Diagnosis not present

## 2021-10-19 DIAGNOSIS — B9689 Other specified bacterial agents as the cause of diseases classified elsewhere: Secondary | ICD-10-CM | POA: Diagnosis not present

## 2021-10-19 DIAGNOSIS — N760 Acute vaginitis: Secondary | ICD-10-CM | POA: Diagnosis not present

## 2021-11-28 DIAGNOSIS — M778 Other enthesopathies, not elsewhere classified: Secondary | ICD-10-CM | POA: Diagnosis not present

## 2022-02-23 DIAGNOSIS — G932 Benign intracranial hypertension: Secondary | ICD-10-CM | POA: Diagnosis not present

## 2022-04-05 DIAGNOSIS — Z7984 Long term (current) use of oral hypoglycemic drugs: Secondary | ICD-10-CM | POA: Diagnosis not present

## 2022-04-05 DIAGNOSIS — Z8249 Family history of ischemic heart disease and other diseases of the circulatory system: Secondary | ICD-10-CM | POA: Diagnosis not present

## 2022-04-05 DIAGNOSIS — Z91018 Allergy to other foods: Secondary | ICD-10-CM | POA: Diagnosis not present

## 2022-04-05 DIAGNOSIS — Z803 Family history of malignant neoplasm of breast: Secondary | ICD-10-CM | POA: Diagnosis not present

## 2022-04-05 DIAGNOSIS — E669 Obesity, unspecified: Secondary | ICD-10-CM | POA: Diagnosis not present

## 2022-04-05 DIAGNOSIS — E119 Type 2 diabetes mellitus without complications: Secondary | ICD-10-CM | POA: Diagnosis not present

## 2022-04-05 DIAGNOSIS — Z833 Family history of diabetes mellitus: Secondary | ICD-10-CM | POA: Diagnosis not present

## 2022-04-05 DIAGNOSIS — Z818 Family history of other mental and behavioral disorders: Secondary | ICD-10-CM | POA: Diagnosis not present

## 2022-04-05 DIAGNOSIS — Z883 Allergy status to other anti-infective agents status: Secondary | ICD-10-CM | POA: Diagnosis not present

## 2022-04-05 DIAGNOSIS — Z6830 Body mass index (BMI) 30.0-30.9, adult: Secondary | ICD-10-CM | POA: Diagnosis not present

## 2022-04-19 DIAGNOSIS — R7303 Prediabetes: Secondary | ICD-10-CM | POA: Diagnosis not present

## 2022-04-26 DIAGNOSIS — D1800 Hemangioma unspecified site: Secondary | ICD-10-CM | POA: Diagnosis not present

## 2022-04-26 DIAGNOSIS — R7303 Prediabetes: Secondary | ICD-10-CM | POA: Diagnosis not present

## 2022-04-26 DIAGNOSIS — E669 Obesity, unspecified: Secondary | ICD-10-CM | POA: Diagnosis not present

## 2022-04-26 DIAGNOSIS — G932 Benign intracranial hypertension: Secondary | ICD-10-CM | POA: Diagnosis not present

## 2022-04-26 DIAGNOSIS — Z13220 Encounter for screening for lipoid disorders: Secondary | ICD-10-CM | POA: Diagnosis not present

## 2022-04-26 DIAGNOSIS — D500 Iron deficiency anemia secondary to blood loss (chronic): Secondary | ICD-10-CM | POA: Diagnosis not present

## 2022-04-26 DIAGNOSIS — R519 Headache, unspecified: Secondary | ICD-10-CM | POA: Diagnosis not present

## 2022-04-26 DIAGNOSIS — Z1329 Encounter for screening for other suspected endocrine disorder: Secondary | ICD-10-CM | POA: Diagnosis not present

## 2022-04-26 DIAGNOSIS — Z8679 Personal history of other diseases of the circulatory system: Secondary | ICD-10-CM | POA: Diagnosis not present

## 2022-08-30 DIAGNOSIS — R7303 Prediabetes: Secondary | ICD-10-CM | POA: Diagnosis not present

## 2022-08-30 DIAGNOSIS — Z13220 Encounter for screening for lipoid disorders: Secondary | ICD-10-CM | POA: Diagnosis not present

## 2022-09-11 DIAGNOSIS — Z1331 Encounter for screening for depression: Secondary | ICD-10-CM | POA: Diagnosis not present

## 2022-09-11 DIAGNOSIS — R519 Headache, unspecified: Secondary | ICD-10-CM | POA: Diagnosis not present

## 2022-09-11 DIAGNOSIS — Z0000 Encounter for general adult medical examination without abnormal findings: Secondary | ICD-10-CM | POA: Diagnosis not present

## 2022-09-11 DIAGNOSIS — D1800 Hemangioma unspecified site: Secondary | ICD-10-CM | POA: Diagnosis not present

## 2022-09-11 DIAGNOSIS — R7303 Prediabetes: Secondary | ICD-10-CM | POA: Diagnosis not present

## 2022-09-11 DIAGNOSIS — D259 Leiomyoma of uterus, unspecified: Secondary | ICD-10-CM | POA: Diagnosis not present

## 2022-09-11 DIAGNOSIS — D500 Iron deficiency anemia secondary to blood loss (chronic): Secondary | ICD-10-CM | POA: Diagnosis not present

## 2022-09-11 DIAGNOSIS — G932 Benign intracranial hypertension: Secondary | ICD-10-CM | POA: Diagnosis not present

## 2022-09-11 DIAGNOSIS — Z8679 Personal history of other diseases of the circulatory system: Secondary | ICD-10-CM | POA: Diagnosis not present

## 2022-09-11 DIAGNOSIS — N939 Abnormal uterine and vaginal bleeding, unspecified: Secondary | ICD-10-CM | POA: Diagnosis not present

## 2022-09-11 DIAGNOSIS — E669 Obesity, unspecified: Secondary | ICD-10-CM | POA: Diagnosis not present

## 2022-12-06 DIAGNOSIS — Z8249 Family history of ischemic heart disease and other diseases of the circulatory system: Secondary | ICD-10-CM | POA: Diagnosis not present

## 2022-12-06 DIAGNOSIS — D509 Iron deficiency anemia, unspecified: Secondary | ICD-10-CM | POA: Diagnosis not present

## 2022-12-06 DIAGNOSIS — E119 Type 2 diabetes mellitus without complications: Secondary | ICD-10-CM | POA: Diagnosis not present

## 2022-12-06 DIAGNOSIS — Z833 Family history of diabetes mellitus: Secondary | ICD-10-CM | POA: Diagnosis not present

## 2022-12-06 DIAGNOSIS — Z7984 Long term (current) use of oral hypoglycemic drugs: Secondary | ICD-10-CM | POA: Diagnosis not present

## 2022-12-06 DIAGNOSIS — K5900 Constipation, unspecified: Secondary | ICD-10-CM | POA: Diagnosis not present

## 2023-03-21 DIAGNOSIS — R7303 Prediabetes: Secondary | ICD-10-CM | POA: Diagnosis not present

## 2023-03-21 DIAGNOSIS — D500 Iron deficiency anemia secondary to blood loss (chronic): Secondary | ICD-10-CM | POA: Diagnosis not present

## 2023-03-26 DIAGNOSIS — Z8679 Personal history of other diseases of the circulatory system: Secondary | ICD-10-CM | POA: Diagnosis not present

## 2023-03-26 DIAGNOSIS — R7303 Prediabetes: Secondary | ICD-10-CM | POA: Diagnosis not present

## 2023-03-26 DIAGNOSIS — R519 Headache, unspecified: Secondary | ICD-10-CM | POA: Diagnosis not present

## 2023-03-26 DIAGNOSIS — D259 Leiomyoma of uterus, unspecified: Secondary | ICD-10-CM | POA: Diagnosis not present

## 2023-03-26 DIAGNOSIS — D500 Iron deficiency anemia secondary to blood loss (chronic): Secondary | ICD-10-CM | POA: Diagnosis not present

## 2023-03-26 DIAGNOSIS — Z13220 Encounter for screening for lipoid disorders: Secondary | ICD-10-CM | POA: Diagnosis not present

## 2023-03-26 DIAGNOSIS — G932 Benign intracranial hypertension: Secondary | ICD-10-CM | POA: Diagnosis not present

## 2023-03-26 DIAGNOSIS — D1800 Hemangioma unspecified site: Secondary | ICD-10-CM | POA: Diagnosis not present

## 2023-03-26 DIAGNOSIS — E66811 Obesity, class 1: Secondary | ICD-10-CM | POA: Diagnosis not present

## 2023-03-26 DIAGNOSIS — N939 Abnormal uterine and vaginal bleeding, unspecified: Secondary | ICD-10-CM | POA: Diagnosis not present

## 2023-03-26 DIAGNOSIS — Z1329 Encounter for screening for other suspected endocrine disorder: Secondary | ICD-10-CM | POA: Diagnosis not present

## 2023-05-15 DIAGNOSIS — Z7984 Long term (current) use of oral hypoglycemic drugs: Secondary | ICD-10-CM | POA: Diagnosis not present

## 2023-05-15 DIAGNOSIS — E669 Obesity, unspecified: Secondary | ICD-10-CM | POA: Diagnosis not present

## 2023-05-15 DIAGNOSIS — Z6831 Body mass index (BMI) 31.0-31.9, adult: Secondary | ICD-10-CM | POA: Diagnosis not present

## 2023-05-15 DIAGNOSIS — D509 Iron deficiency anemia, unspecified: Secondary | ICD-10-CM | POA: Diagnosis not present

## 2023-05-15 DIAGNOSIS — Z8249 Family history of ischemic heart disease and other diseases of the circulatory system: Secondary | ICD-10-CM | POA: Diagnosis not present

## 2023-05-15 DIAGNOSIS — K5900 Constipation, unspecified: Secondary | ICD-10-CM | POA: Diagnosis not present

## 2023-05-15 DIAGNOSIS — Z833 Family history of diabetes mellitus: Secondary | ICD-10-CM | POA: Diagnosis not present

## 2023-05-15 DIAGNOSIS — E119 Type 2 diabetes mellitus without complications: Secondary | ICD-10-CM | POA: Diagnosis not present

## 2023-05-15 DIAGNOSIS — Z91041 Radiographic dye allergy status: Secondary | ICD-10-CM | POA: Diagnosis not present

## 2023-10-10 DIAGNOSIS — R7303 Prediabetes: Secondary | ICD-10-CM | POA: Diagnosis not present

## 2023-10-10 DIAGNOSIS — Z13220 Encounter for screening for lipoid disorders: Secondary | ICD-10-CM | POA: Diagnosis not present

## 2023-10-16 DIAGNOSIS — G932 Benign intracranial hypertension: Secondary | ICD-10-CM | POA: Diagnosis not present

## 2023-10-16 DIAGNOSIS — Z1331 Encounter for screening for depression: Secondary | ICD-10-CM | POA: Diagnosis not present

## 2023-10-16 DIAGNOSIS — E66811 Obesity, class 1: Secondary | ICD-10-CM | POA: Diagnosis not present

## 2023-10-16 DIAGNOSIS — D1800 Hemangioma unspecified site: Secondary | ICD-10-CM | POA: Diagnosis not present

## 2023-10-16 DIAGNOSIS — Z0000 Encounter for general adult medical examination without abnormal findings: Secondary | ICD-10-CM | POA: Diagnosis not present

## 2023-10-16 DIAGNOSIS — D259 Leiomyoma of uterus, unspecified: Secondary | ICD-10-CM | POA: Diagnosis not present

## 2023-10-16 DIAGNOSIS — N939 Abnormal uterine and vaginal bleeding, unspecified: Secondary | ICD-10-CM | POA: Diagnosis not present

## 2023-10-16 DIAGNOSIS — E785 Hyperlipidemia, unspecified: Secondary | ICD-10-CM | POA: Diagnosis not present

## 2023-10-16 DIAGNOSIS — R519 Headache, unspecified: Secondary | ICD-10-CM | POA: Diagnosis not present

## 2023-10-16 DIAGNOSIS — D500 Iron deficiency anemia secondary to blood loss (chronic): Secondary | ICD-10-CM | POA: Diagnosis not present

## 2023-10-16 DIAGNOSIS — Z8679 Personal history of other diseases of the circulatory system: Secondary | ICD-10-CM | POA: Diagnosis not present

## 2023-10-16 DIAGNOSIS — R7303 Prediabetes: Secondary | ICD-10-CM | POA: Diagnosis not present
